# Patient Record
Sex: Male | Born: 1965 | Hispanic: No | Marital: Married | Smoking: Former smoker
Health system: Southern US, Community
[De-identification: ages and names within clinical notes are randomized; demographics above are authoritative.]

## PROBLEM LIST (undated history)

## (undated) DIAGNOSIS — K810 Acute cholecystitis: Secondary | ICD-10-CM

## (undated) DIAGNOSIS — I10 Essential (primary) hypertension: Secondary | ICD-10-CM

## (undated) HISTORY — DX: Acute cholecystitis: K81.0

---

## 2001-04-16 ENCOUNTER — Ambulatory Visit (HOSPITAL_BASED_OUTPATIENT_CLINIC_OR_DEPARTMENT_OTHER): Admission: RE | Admit: 2001-04-16 | Discharge: 2001-04-16 | Payer: Self-pay | Admitting: *Deleted

## 2001-04-16 ENCOUNTER — Encounter: Payer: Self-pay | Admitting: Emergency Medicine

## 2001-09-18 ENCOUNTER — Emergency Department (HOSPITAL_COMMUNITY): Admission: EM | Admit: 2001-09-18 | Discharge: 2001-09-18 | Payer: Self-pay | Admitting: Emergency Medicine

## 2008-10-23 ENCOUNTER — Emergency Department (HOSPITAL_COMMUNITY): Admission: EM | Admit: 2008-10-23 | Discharge: 2008-10-23 | Payer: Self-pay | Admitting: Emergency Medicine

## 2010-08-20 NOTE — Op Note (Signed)
[place]  Patient:    Derek Pierce, Derek Pierce Visit Number: [id number] MRN: [id number]          Service Type: Attending:  Lowell Bouton, M.D. Dictated by:   Lowell Bouton, M.D. Proc. Date: 04/16/01                             Operative Report  PREOPERATIVE DIAGNOSIS:  Open fracture distal phalanx with nail bed injury secondary to crushing, left ring finger.  POSTOPERATIVE DIAGNOSIS:  Open fracture distal phalanx with nail bed injury secondary to crushing, left ring finger.  OPERATION PERFORMED:  Open reduction of distal phalanx with repair of nail bed and pulp laceration left ring finger.  SURGEON:  Lowell Bouton, M.D.  ANESTHESIA:  0.5% Marcaine local.  OPERATIVE FINDINGS:  The patient had a displaced distal phalanx fracture that was opened, involved the nail bed and the pulp tissue.  DESCRIPTION OF PROCEDURE:  Under 0.5% Marcaine local anesthesia, the left hand was prepped and draped in the usual fashion in the minor room.  A Penrose drain tourniquet was used at the base of the digit for bleeding control.  The nail plate was removed and the fracture was identified through the rent in the nail bed.  The fracture was irrigated copiously with saline and was reduced. The nail bed was repaired with 5-0 chromic and the nail plate was reapplied underneath the eponychial fold.  The pulp lacerations were then repaired with 5-0 chromic.  Sterile dressings were applied.  The tourniquet was released. The patient was discharged in good condition. Dictated by:   Lowell Bouton, M.D. Attending:  Lowell Bouton, M.D. DD:  04/16/01 TD:  04/16/01 Job: [id number]

## 2010-08-26 ENCOUNTER — Encounter (HOSPITAL_COMMUNITY): Payer: Self-pay | Admitting: Radiology

## 2010-08-26 ENCOUNTER — Emergency Department (HOSPITAL_COMMUNITY): Payer: Self-pay

## 2010-08-26 ENCOUNTER — Emergency Department (HOSPITAL_COMMUNITY)
Admission: EM | Admit: 2010-08-26 | Discharge: 2010-08-26 | Disposition: A | Discharge status: Home / Self Care | Payer: Self-pay | Attending: Emergency Medicine | Admitting: Emergency Medicine

## 2010-08-26 DIAGNOSIS — K7689 Other specified diseases of liver: Secondary | ICD-10-CM | POA: Insufficient documentation

## 2010-08-26 DIAGNOSIS — N289 Disorder of kidney and ureter, unspecified: Secondary | ICD-10-CM | POA: Insufficient documentation

## 2010-08-26 DIAGNOSIS — R109 Unspecified abdominal pain: Secondary | ICD-10-CM | POA: Insufficient documentation

## 2010-08-26 LAB — CBC
HCT: 45.3 % (ref 39.0–52.0)
Hemoglobin: 15.9 g/dL (ref 13.0–17.0)
MCH: 29.7 pg (ref 26.0–34.0)
MCHC: 35.1 g/dL (ref 30.0–36.0)
MCV: 84.7 fL (ref 78.0–100.0)
Platelets: 217 10*3/uL (ref 150–400)
RBC: 5.35 MIL/uL (ref 4.22–5.81)
RDW: 12.8 % (ref 11.5–15.5)
WBC: 8 10*3/uL (ref 4.0–10.5)

## 2010-08-26 LAB — URINALYSIS, ROUTINE W REFLEX MICROSCOPIC
Bilirubin Urine: NEGATIVE
Glucose, UA: NEGATIVE mg/dL
Hgb urine dipstick: NEGATIVE
Ketones, ur: NEGATIVE mg/dL
Nitrite: NEGATIVE
Protein, ur: NEGATIVE mg/dL
Specific Gravity, Urine: 1.027 (ref 1.005–1.030)
Urobilinogen, UA: 1 mg/dL (ref 0.0–1.0)
pH: 7 (ref 5.0–8.0)

## 2010-08-26 LAB — COMPREHENSIVE METABOLIC PANEL
ALT: 56 U/L — ABNORMAL HIGH (ref 0–53)
AST: 30 U/L (ref 0–37)
Albumin: 4 g/dL (ref 3.5–5.2)
Alkaline Phosphatase: 70 U/L (ref 39–117)
BUN: 15 mg/dL (ref 6–23)
CO2: 23 mEq/L (ref 19–32)
Calcium: 9 mg/dL (ref 8.4–10.5)
Chloride: 100 mEq/L (ref 96–112)
Creatinine, Ser: 0.91 mg/dL (ref 0.4–1.5)
GFR calc Af Amer: >60 mL/min (ref >60)
GFR calc non Af Amer: >60 mL/min (ref >60)
Glucose, Bld: 117 mg/dL — ABNORMAL HIGH (ref 70–99)
Potassium: 3.8 mEq/L (ref 3.5–5.1)
Sodium: 136 mEq/L (ref 135–145)
Total Bilirubin: 0.3 mg/dL (ref 0.3–1.2)
Total Protein: 7.1 g/dL (ref 6.0–8.3)

## 2010-08-26 LAB — DIFFERENTIAL
Basophils Absolute: 0 10*3/uL (ref 0.0–0.1)
Basophils Relative: 0 % (ref 0–1)
Eosinophils Absolute: 0.2 10*3/uL (ref 0.0–0.7)
Eosinophils Relative: 2 % (ref 0–5)
Lymphocytes Relative: 22 % (ref 12–46)
Lymphs Abs: 1.8 10*3/uL (ref 0.7–4.0)
Monocytes Absolute: 0.3 10*3/uL (ref 0.1–1.0)
Monocytes Relative: 4 % (ref 3–12)
Neutro Abs: 5.7 10*3/uL (ref 1.7–7.7)
Neutrophils Relative %: 71 % (ref 43–77)

## 2010-08-26 LAB — LIPASE, BLOOD: Lipase: 33 U/L (ref 11–59)

## 2010-08-26 MED ORDER — IOHEXOL 300 MG/ML  SOLN
100.0000 mL | Freq: Once | INTRAMUSCULAR | Status: AC | PRN
  Administered 2010-08-26: 100 mL via INTRAVENOUS

## 2010-09-03 ENCOUNTER — Other Ambulatory Visit (HOSPITAL_COMMUNITY): Payer: Self-pay | Admitting: Internal Medicine

## 2010-09-03 DIAGNOSIS — N2889 Other specified disorders of kidney and ureter: Secondary | ICD-10-CM

## 2010-09-08 ENCOUNTER — Ambulatory Visit (HOSPITAL_COMMUNITY)
Admission: RE | Admit: 2010-09-08 | Discharge: 2010-09-08 | Disposition: A | Discharge status: Home / Self Care | Payer: Self-pay | Source: Ambulatory Visit | Attending: Internal Medicine | Admitting: Internal Medicine

## 2010-09-08 DIAGNOSIS — N2889 Other specified disorders of kidney and ureter: Secondary | ICD-10-CM

## 2010-09-08 DIAGNOSIS — K8020 Calculus of gallbladder without cholecystitis without obstruction: Secondary | ICD-10-CM | POA: Insufficient documentation

## 2010-09-08 DIAGNOSIS — K7689 Other specified diseases of liver: Secondary | ICD-10-CM | POA: Insufficient documentation

## 2010-09-08 DIAGNOSIS — N289 Disorder of kidney and ureter, unspecified: Secondary | ICD-10-CM | POA: Insufficient documentation

## 2010-09-08 MED ORDER — GADOBENATE DIMEGLUMINE 529 MG/ML IV SOLN
20.0000 mL | Freq: Once | INTRAVENOUS | Status: AC | PRN
  Administered 2010-09-08: 20 mL via INTRAVENOUS

## 2012-05-18 ENCOUNTER — Encounter (HOSPITAL_COMMUNITY): Payer: Self-pay | Admitting: *Deleted

## 2012-05-18 DIAGNOSIS — R109 Unspecified abdominal pain: Secondary | ICD-10-CM | POA: Insufficient documentation

## 2012-05-18 DIAGNOSIS — K8000 Calculus of gallbladder with acute cholecystitis without obstruction: Principal | ICD-10-CM | POA: Insufficient documentation

## 2012-05-18 LAB — CBC WITH DIFFERENTIAL/PLATELET
Basophils Relative: 0 % (ref 0–1)
Hemoglobin: 15.7 g/dL (ref 13.0–17.0)
Lymphs Abs: 1.1 10*3/uL (ref 0.7–4.0)
Monocytes Relative: 2 % — ABNORMAL LOW (ref 3–12)
Neutro Abs: 8.9 10*3/uL — ABNORMAL HIGH (ref 1.7–7.7)
Neutrophils Relative %: 86 % — ABNORMAL HIGH (ref 43–77)
Platelets: 222 10*3/uL (ref 150–400)
RBC: 5.21 MIL/uL (ref 4.22–5.81)
WBC: 10.3 10*3/uL (ref 4.0–10.5)

## 2012-05-18 LAB — COMPREHENSIVE METABOLIC PANEL
ALT: 50 U/L (ref 0–53)
Albumin: 4.1 g/dL (ref 3.5–5.2)
Alkaline Phosphatase: 78 U/L (ref 39–117)
BUN: 15 mg/dL (ref 6–23)
Chloride: 101 mEq/L (ref 96–112)
Glucose, Bld: 117 mg/dL — ABNORMAL HIGH (ref 70–99)
Potassium: 3.7 mEq/L (ref 3.5–5.1)
Sodium: 137 mEq/L (ref 135–145)
Total Bilirubin: 0.3 mg/dL (ref 0.3–1.2)

## 2012-05-18 LAB — LIPASE, BLOOD: Lipase: 25 U/L (ref 11–59)

## 2012-05-18 NOTE — ED Notes (Signed)
The pt is c/o abd pain since 1500 today .  No n v or diarrhea.

## 2012-05-19 ENCOUNTER — Emergency Department (HOSPITAL_COMMUNITY): Payer: Self-pay

## 2012-05-19 ENCOUNTER — Encounter (HOSPITAL_COMMUNITY): Payer: Self-pay | Admitting: Radiology

## 2012-05-19 ENCOUNTER — Observation Stay (HOSPITAL_COMMUNITY)
Admission: EM | Admit: 2012-05-19 | Discharge: 2012-05-21 | Disposition: A | Discharge status: Home / Self Care | Payer: Self-pay | Attending: Surgery | Admitting: Surgery

## 2012-05-19 DIAGNOSIS — K8066 Calculus of gallbladder and bile duct with acute and chronic cholecystitis without obstruction: Secondary | ICD-10-CM

## 2012-05-19 DIAGNOSIS — K810 Acute cholecystitis: Secondary | ICD-10-CM

## 2012-05-19 LAB — URINALYSIS, ROUTINE W REFLEX MICROSCOPIC
Bilirubin Urine: NEGATIVE
Hgb urine dipstick: NEGATIVE
Ketones, ur: 15 mg/dL — AB
Nitrite: NEGATIVE
Specific Gravity, Urine: 1.023 (ref 1.005–1.030)
pH: 6 (ref 5.0–8.0)

## 2012-05-19 MED ORDER — IOHEXOL 300 MG/ML  SOLN
20.0000 mL | INTRAMUSCULAR | Status: DC
  Administered 2012-05-19: 50 mL via ORAL

## 2012-05-19 MED ORDER — PANTOPRAZOLE SODIUM 40 MG IV SOLR
40.0000 mg | Freq: Every day | INTRAVENOUS | Status: DC
  Administered 2012-05-19 – 2012-05-20 (×2): 40 mg via INTRAVENOUS
  Filled 2012-05-19 (×4): qty 40

## 2012-05-19 MED ORDER — SODIUM CHLORIDE 0.9 % IV SOLN
3.0000 g | Freq: Four times a day (QID) | INTRAVENOUS | Status: DC
  Administered 2012-05-19 – 2012-05-20 (×5): 3 g via INTRAVENOUS
  Filled 2012-05-19 (×7): qty 3

## 2012-05-19 MED ORDER — ONDANSETRON HCL 4 MG/2ML IJ SOLN: 4.0000 mg | Freq: Four times a day (QID) | INTRAMUSCULAR | Status: DC | PRN

## 2012-05-19 MED ORDER — ACETAMINOPHEN 325 MG PO TABS
650.0000 mg | ORAL_TABLET | Freq: Four times a day (QID) | ORAL | Status: DC | PRN
  Administered 2012-05-19 – 2012-05-20 (×2): 650 mg via ORAL
  Filled 2012-05-19 (×2): qty 1
  Filled 2012-05-19: qty 2

## 2012-05-19 MED ORDER — HYDROMORPHONE HCL PF 1 MG/ML IJ SOLN
1.0000 mg | INTRAMUSCULAR | Status: DC | PRN
  Administered 2012-05-19: 1 mg via INTRAVENOUS
  Filled 2012-05-19: qty 1

## 2012-05-19 MED ORDER — KCL IN DEXTROSE-NACL 20-5-0.45 MEQ/L-%-% IV SOLN
INTRAVENOUS | Status: DC
  Administered 2012-05-19 – 2012-05-21 (×4): via INTRAVENOUS
  Filled 2012-05-19 (×8): qty 1000

## 2012-05-19 MED ORDER — IOHEXOL 300 MG/ML  SOLN
100.0000 mL | Freq: Once | INTRAMUSCULAR | Status: AC | PRN
  Administered 2012-05-19: 100 mL via INTRAVENOUS

## 2012-05-19 MED ORDER — SODIUM CHLORIDE 0.9 % IV BOLUS (SEPSIS)
500.0000 mL | Freq: Once | INTRAVENOUS | Status: AC
  Administered 2012-05-19: 500 mL via INTRAVENOUS

## 2012-05-19 MED ORDER — HYDROMORPHONE HCL PF 1 MG/ML IJ SOLN
1.0000 mg | Freq: Once | INTRAMUSCULAR | Status: AC
  Administered 2012-05-19: 1 mg via INTRAVENOUS
  Filled 2012-05-19: qty 1

## 2012-05-19 MED ORDER — ENOXAPARIN SODIUM 40 MG/0.4ML ~~LOC~~ SOLN
40.0000 mg | SUBCUTANEOUS | Status: DC
  Administered 2012-05-19 – 2012-05-21 (×2): 40 mg via SUBCUTANEOUS
  Filled 2012-05-19 (×3): qty 0.4

## 2012-05-19 MED ORDER — SODIUM CHLORIDE 0.9 % IV SOLN
INTRAVENOUS | Status: DC
  Administered 2012-05-19: 06:00:00 via INTRAVENOUS

## 2012-05-19 MED ORDER — ONDANSETRON HCL 4 MG/2ML IJ SOLN
4.0000 mg | Freq: Once | INTRAMUSCULAR | Status: AC
  Administered 2012-05-19: 4 mg via INTRAVENOUS
  Filled 2012-05-19: qty 2

## 2012-05-19 MED ORDER — ACETAMINOPHEN 650 MG RE SUPP: 650.0000 mg | Freq: Four times a day (QID) | RECTAL | Status: DC | PRN

## 2012-05-19 NOTE — ED Notes (Signed)
Patient c/o abd pain  Rubbing his entire abd area.  No vomiting but has been nauseated.

## 2012-05-19 NOTE — H&P (Signed)
Derek Pierce is an 47 y.o. male.   Chief Complaint: Abdominal pain with cholelithiasis HPI: Started having abdominal pain with nausea and vomiting about 1500 on February 14, no fevers or chills, no jaundice or pale stools.  History reviewed. No pertinent past medical history.  History reviewed. No pertinent past surgical history.  No family history on file. Social History:  reports that he has quit smoking. He does not have any smokeless tobacco history on file. He reports that he does not drink alcohol. His drug history is not on file.  Allergies: No Known Allergies   (Not in a hospital admission)  Results for orders placed during the hospital encounter of 05/19/12 (from the past 48 hour(s))  CBC WITH DIFFERENTIAL     Status: Abnormal   Collection Time    05/18/12  8:23 PM      Result Value Range   WBC 10.3  4.0 - 10.5 K/uL   RBC 5.21  4.22 - 5.81 MIL/uL   Hemoglobin 15.7  13.0 - 17.0 g/dL   HCT 16.1  09.6 - 04.5 %   MCV 85.4  78.0 - 100.0 fL   MCH 30.1  26.0 - 34.0 pg   MCHC 35.3  30.0 - 36.0 g/dL   RDW 40.9  81.1 - 91.4 %   Platelets 222  150 - 400 K/uL   Neutrophils Relative 86 (*) 43 - 77 %   Neutro Abs 8.9 (*) 1.7 - 7.7 K/uL   Lymphocytes Relative 11 (*) 12 - 46 %   Lymphs Abs 1.1  0.7 - 4.0 K/uL   Monocytes Relative 2 (*) 3 - 12 %   Monocytes Absolute 0.2  0.1 - 1.0 K/uL   Eosinophils Relative 1  0 - 5 %   Eosinophils Absolute 0.1  0.0 - 0.7 K/uL   Basophils Relative 0  0 - 1 %   Basophils Absolute 0.0  0.0 - 0.1 K/uL  COMPREHENSIVE METABOLIC PANEL     Status: Abnormal   Collection Time    05/18/12  8:23 PM      Result Value Range   Sodium 137  135 - 145 mEq/L   Potassium 3.7  3.5 - 5.1 mEq/L   Chloride 101  96 - 112 mEq/L   CO2 20  19 - 32 mEq/L   Glucose, Bld 117 (*) 70 - 99 mg/dL   BUN 15  6 - 23 mg/dL   Creatinine, Ser 7.82  0.50 - 1.35 mg/dL   Calcium 9.2  8.4 - 95.6 mg/dL   Total Protein 7.5  6.0 - 8.3 g/dL   Albumin 4.1  3.5 - 5.2 g/dL   AST  35  0 - 37 U/L   ALT 50  0 - 53 U/L   Alkaline Phosphatase 78  39 - 117 U/L   Total Bilirubin 0.3  0.3 - 1.2 mg/dL   GFR calc non Af Amer >90  >90 mL/min   GFR calc Af Amer >90  >90 mL/min   Comment:            The eGFR has been calculated     using the CKD EPI equation.     This calculation has not been     validated in all clinical     situations.     eGFR's persistently     <90 mL/min signify     possible Chronic Kidney Disease.  LIPASE, BLOOD     Status: None   Collection Time  05/18/12  8:23 PM      Result Value Range   Lipase 25  11 - 59 U/L  URINALYSIS, ROUTINE W REFLEX MICROSCOPIC     Status: Abnormal   Collection Time    05/19/12 12:43 AM      Result Value Range   Color, Urine YELLOW  YELLOW   APPearance CLOUDY (*) CLEAR   Specific Gravity, Urine 1.023  1.005 - 1.030   pH 6.0  5.0 - 8.0   Glucose, UA NEGATIVE  NEGATIVE mg/dL   Hgb urine dipstick NEGATIVE  NEGATIVE   Bilirubin Urine NEGATIVE  NEGATIVE   Ketones, ur 15 (*) NEGATIVE mg/dL   Protein, ur NEGATIVE  NEGATIVE mg/dL   Urobilinogen, UA 1.0  0.0 - 1.0 mg/dL   Nitrite NEGATIVE  NEGATIVE   Leukocytes, UA NEGATIVE  NEGATIVE   Comment: MICROSCOPIC NOT DONE ON URINES WITH NEGATIVE PROTEIN, BLOOD, LEUKOCYTES, NITRITE, OR GLUCOSE <1000 mg/dL.   US Abdomen Complete  05/19/2012  *RADIOLOGY REPORT*  Clinical Data:  Abdominal pain.  Concern for cholecystitis on CT.  ABDOMINAL ULTRASOUND COMPLETE  Comparison:  CT of the abdomen and pelvis performed earlier today at 02:26 a.m., and MRI of the abdomen performed 09/08/2010  Findings:  Gallbladder: Multiple stones are seen layering dependently within the gallbladder.  The gallbladder wall is markedly thickened, measuring up to 1.1 cm, with suggestion of trace pericholecystic fluid, raising concern for acute cholecystitis.  No ultrasonographic Murphy's sign is elicited, though the patient is relatively sedated.  Common Bile Duct:  0.5 cm in diameter; within normal limits in  caliber.  Liver:  Diffusely increased hepatic echogenicity and coarsened echotexture, compatible with fatty infiltration; no focal lesions identified.  Limited Doppler evaluation demonstrates normal blood flow within the liver.  IVC:  Unremarkable in appearance.  Pancreas:  Although the pancreas is difficult to visualize due to overlying bowel gas, no focal pancreatic abnormality is identified.  Spleen:  12.8 cm in length; within normal limits in size and echotexture.  Right kidney:  10.9 cm in length; normal in size, configuration and parenchymal echogenicity.  No evidence of hydronephrosis.  The patient's known right renal lesion is not well characterized on ultrasound.  Left kidney:  11.8 cm in length; normal in size, configuration and parenchymal echogenicity.  No evidence of mass or hydronephrosis.  Abdominal Aorta:  Normal in caliber; no aneurysm identified.  IMPRESSION:  1.  Marked thickening of the gallbladder wall, measuring up to 1.1 cm, with suggestion of trace pericholecystic fluid, raising concern for acute cholecystitis.  No ultrasonographic Murphy's sign elicited, though the patient is relatively sedated.  Stones noted within the gallbladder. 2.  Diffuse fatty infiltration within the liver. 3.  Known small right renal lesion is not well characterized on ultrasound.   Original Report Authenticated By: Tonia Ghent, M.D.    Ct Abdomen Pelvis W Contrast  05/19/2012  *RADIOLOGY REPORT*  Clinical Data: Diffuse abdominal pain.  CT ABDOMEN AND PELVIS WITH CONTRAST  Technique:  Multidetector CT imaging of the abdomen and pelvis was performed following the standard protocol during bolus administration of intravenous contrast.  Contrast: OMNIPAQUE IOHEXOL 300 MG/ML  SOLN  Comparison: CT of the abdomen and pelvis performed 08/26/2010, and MRI of the abdomen performed 09/08/2010  Findings: The visualized lung bases are clear.  The liver and spleen are unremarkable in appearance.  Mild soft tissue  inflammation is noted about the gallbladder, raising concern for mild acute cholecystitis.  No pericholecystic fluid is seen. The  pancreas and adrenal glands are unremarkable.  A 1.5 cm hypodensity at the interpole region of the right kidney is grossly unchanged from 2012 and likely benign.  There is no evidence of hydronephrosis.  No renal or ureteral stones are seen. No perinephric stranding is appreciated.  No free fluid is identified.  The small bowel is unremarkable in appearance.  The stomach is within normal limits.  No acute vascular abnormalities are seen.  The appendix is normal in caliber, without evidence for appendicitis.  The colon is grossly unremarkable in appearance. Mild fatty infiltration within the wall of the distal sigmoid colon and rectum may reflect chronic sequelae of inflammation.  The bladder is is mildly distended and grossly unremarkable in appearance.  The prostate is normal in size.  No inguinal lymphadenopathy is seen.  No acute osseous abnormalities are identified.  IMPRESSION:  1.  Mild soft tissue inflammation about the gallbladder, raising concern for mild acute cholecystitis.  No pericholecystic fluid seen; no definite stones identified on CT. 2.  Stable 1.5 cm hypodensity at the interpole region of the right kidney is likely benign.   Original Report Authenticated By: Tonia Ghent, M.D.     Review of Systems  Constitutional: Negative for fever and chills.  Gastrointestinal: Positive for nausea, vomiting and abdominal pain. Negative for diarrhea and constipation.  All other systems reviewed and are negative.    Blood pressure 155/91, pulse 90, temperature 98 F (36.7 C), temperature source Oral, resp. rate 18, SpO2 95.00%. Physical Exam   Assessment/Plan Acute cholecystitis with cholelithiasis.  IV antibiotics and IV hydration. NPO. Lap chole with IOC. To OR when available.  Cherylynn Ridges 05/19/2012, 7:16 AM

## 2012-05-19 NOTE — ED Provider Notes (Signed)
History     CSN: [id number]  Arrival date & time 05/18/12  2007   First MD Initiated Contact with Patient 05/19/12 0045      Chief Complaint  Patient presents with  . Abdominal Pain    (Consider location/radiation/quality/duration/timing/severity/associated sxs/prior treatment) Patient is a 47 y.o. male presenting with abdominal pain. The history is provided by the patient and a relative.  Abdominal Pain Associated symptoms: no chest pain, no diarrhea, no dysuria, no fever, no hematuria, no nausea, no shortness of breath and no vomiting    patient is from [place] and [place] is limited however family members speak good [place]. According to family the patient's abdominal pain started at 3:00 on Friday. Thank constant since that time patient feels like it's all over the abdomen not able to localize where it's coming from. Pain seems to be fairly significant patient rates it at 6/10. No nausea vomiting or diarrhea. Says that patient pain does radiate to the back.  Past medical history is negative.  History reviewed. No pertinent past medical history.  History reviewed. No pertinent past surgical history.  No family history on file.  History  Substance Use Topics  . Smoking status: Former Games developer  . Smokeless tobacco: Not on file  . Alcohol Use: No      Review of Systems  Constitutional: Negative for fever.  HENT: Negative for congestion.   Eyes: Negative for visual disturbance.  Respiratory: Negative for shortness of breath.   Cardiovascular: Negative for chest pain.  Gastrointestinal: Positive for abdominal pain. Negative for nausea, vomiting and diarrhea.  Genitourinary: Negative for dysuria and hematuria.  Musculoskeletal: Positive for back pain.  Skin: Negative for rash.  Neurological: Negative for headaches.  Hematological: Does not bruise/bleed easily.    Allergies  Review of patient's allergies indicates no known allergies.  Home Medications   Current  Outpatient Rx  Name  Route  Sig  Dispense  Refill  . acetaminophen (TYLENOL) 325 MG tablet   Oral   Take 650 mg by mouth every 6 (six) hours as needed for pain.           BP 142/90  Pulse 86  Temp(Src) 98 F (36.7 C) (Oral)  Resp 18  SpO2 93%  Physical Exam  Nursing note and vitals reviewed. Constitutional: He is oriented to person, place, and time. He appears well-developed and well-nourished. No distress.  HENT:  Head: Normocephalic and atraumatic.  Mouth/Throat: Oropharynx is clear and moist.  Eyes: Conjunctivae and EOM are normal. Pupils are equal, round, and reactive to light.  Neck: Normal range of motion. Neck supple.  Cardiovascular: Normal rate, regular rhythm and normal heart sounds.   No murmur heard. Pulmonary/Chest: Effort normal and breath sounds normal. No respiratory distress.  Abdominal: Soft. Bowel sounds are normal. There is tenderness.  Tender predominantly right upper quadrant. With mild guarding.  Musculoskeletal: Normal range of motion.  Neurological: He is alert and oriented to person, place, and time. No cranial nerve deficit. He exhibits normal muscle tone. Coordination normal.  Skin: Skin is warm. No erythema.    ED Course  Procedures (including critical care time)  Labs Reviewed  CBC WITH DIFFERENTIAL - Abnormal; Notable for the following:    Neutrophils Relative 86 (*)    Neutro Abs 8.9 (*)    Lymphocytes Relative 11 (*)    Monocytes Relative 2 (*)    All other components within normal limits  COMPREHENSIVE METABOLIC PANEL - Abnormal; Notable for the following:  Glucose, Bld 117 (*)    All other components within normal limits  URINALYSIS, ROUTINE W REFLEX MICROSCOPIC - Abnormal; Notable for the following:    APPearance CLOUDY (*)    Ketones, ur 15 (*)    All other components within normal limits  LIPASE, BLOOD   US Abdomen Complete  05/19/2012  *RADIOLOGY REPORT*  Clinical Data:  Abdominal pain.  Concern for cholecystitis on CT.   ABDOMINAL ULTRASOUND COMPLETE  Comparison:  CT of the abdomen and pelvis performed earlier today at 02:26 a.m., and MRI of the abdomen performed 09/08/2010  Findings:  Gallbladder: Multiple stones are seen layering dependently within the gallbladder.  The gallbladder wall is markedly thickened, measuring up to 1.1 cm, with suggestion of trace pericholecystic fluid, raising concern for acute cholecystitis.  No ultrasonographic Murphy's sign is elicited, though the patient is relatively sedated.  Common Bile Duct:  0.5 cm in diameter; within normal limits in caliber.  Liver:  Diffusely increased hepatic echogenicity and coarsened echotexture, compatible with fatty infiltration; no focal lesions identified.  Limited Doppler evaluation demonstrates normal blood flow within the liver.  IVC:  Unremarkable in appearance.  Pancreas:  Although the pancreas is difficult to visualize due to overlying bowel gas, no focal pancreatic abnormality is identified.  Spleen:  12.8 cm in length; within normal limits in size and echotexture.  Right kidney:  10.9 cm in length; normal in size, configuration and parenchymal echogenicity.  No evidence of hydronephrosis.  The patient's known right renal lesion is not well characterized on ultrasound.  Left kidney:  11.8 cm in length; normal in size, configuration and parenchymal echogenicity.  No evidence of mass or hydronephrosis.  Abdominal Aorta:  Normal in caliber; no aneurysm identified.  IMPRESSION:  1.  Marked thickening of the gallbladder wall, measuring up to 1.1 cm, with suggestion of trace pericholecystic fluid, raising concern for acute cholecystitis.  No ultrasonographic Murphy's sign elicited, though the patient is relatively sedated.  Stones noted within the gallbladder. 2.  Diffuse fatty infiltration within the liver. 3.  Known small right renal lesion is not well characterized on ultrasound.   Original Report Authenticated By: Tonia Ghent, M.D.    Ct Abdomen Pelvis W  Contrast  05/19/2012  *RADIOLOGY REPORT*  Clinical Data: Diffuse abdominal pain.  CT ABDOMEN AND PELVIS WITH CONTRAST  Technique:  Multidetector CT imaging of the abdomen and pelvis was performed following the standard protocol during bolus administration of intravenous contrast.  Contrast: OMNIPAQUE IOHEXOL 300 MG/ML  SOLN  Comparison: CT of the abdomen and pelvis performed 08/26/2010, and MRI of the abdomen performed 09/08/2010  Findings: The visualized lung bases are clear.  The liver and spleen are unremarkable in appearance.  Mild soft tissue inflammation is noted about the gallbladder, raising concern for mild acute cholecystitis.  No pericholecystic fluid is seen. The pancreas and adrenal glands are unremarkable.  A 1.5 cm hypodensity at the interpole region of the right kidney is grossly unchanged from 2012 and likely benign.  There is no evidence of hydronephrosis.  No renal or ureteral stones are seen. No perinephric stranding is appreciated.  No free fluid is identified.  The small bowel is unremarkable in appearance.  The stomach is within normal limits.  No acute vascular abnormalities are seen.  The appendix is normal in caliber, without evidence for appendicitis.  The colon is grossly unremarkable in appearance. Mild fatty infiltration within the wall of the distal sigmoid colon and rectum may reflect chronic sequelae of  inflammation.  The bladder is is mildly distended and grossly unremarkable in appearance.  The prostate is normal in size.  No inguinal lymphadenopathy is seen.  No acute osseous abnormalities are identified.  IMPRESSION:  1.  Mild soft tissue inflammation about the gallbladder, raising concern for mild acute cholecystitis.  No pericholecystic fluid seen; no definite stones identified on CT. 2.  Stable 1.5 cm hypodensity at the interpole region of the right kidney is likely benign.   Original Report Authenticated By: Tonia Ghent, M.D.    Results for orders placed during the  hospital encounter of 05/19/12  CBC WITH DIFFERENTIAL      Result Value Range   WBC 10.3  4.0 - 10.5 K/uL   RBC 5.21  4.22 - 5.81 MIL/uL   Hemoglobin 15.7  13.0 - 17.0 g/dL   HCT 95.6  21.3 - 08.6 %   MCV 85.4  78.0 - 100.0 fL   MCH 30.1  26.0 - 34.0 pg   MCHC 35.3  30.0 - 36.0 g/dL   RDW 57.8  46.9 - 62.9 %   Platelets 222  150 - 400 K/uL   Neutrophils Relative 86 (*) 43 - 77 %   Neutro Abs 8.9 (*) 1.7 - 7.7 K/uL   Lymphocytes Relative 11 (*) 12 - 46 %   Lymphs Abs 1.1  0.7 - 4.0 K/uL   Monocytes Relative 2 (*) 3 - 12 %   Monocytes Absolute 0.2  0.1 - 1.0 K/uL   Eosinophils Relative 1  0 - 5 %   Eosinophils Absolute 0.1  0.0 - 0.7 K/uL   Basophils Relative 0  0 - 1 %   Basophils Absolute 0.0  0.0 - 0.1 K/uL  COMPREHENSIVE METABOLIC PANEL      Result Value Range   Sodium 137  135 - 145 mEq/L   Potassium 3.7  3.5 - 5.1 mEq/L   Chloride 101  96 - 112 mEq/L   CO2 20  19 - 32 mEq/L   Glucose, Bld 117 (*) 70 - 99 mg/dL   BUN 15  6 - 23 mg/dL   Creatinine, Ser 5.28  0.50 - 1.35 mg/dL   Calcium 9.2  8.4 - 41.3 mg/dL   Total Protein 7.5  6.0 - 8.3 g/dL   Albumin 4.1  3.5 - 5.2 g/dL   AST 35  0 - 37 U/L   ALT 50  0 - 53 U/L   Alkaline Phosphatase 78  39 - 117 U/L   Total Bilirubin 0.3  0.3 - 1.2 mg/dL   GFR calc non Af Amer >90  >90 mL/min   GFR calc Af Amer >90  >90 mL/min  LIPASE, BLOOD      Result Value Range   Lipase 25  11 - 59 U/L  URINALYSIS, ROUTINE W REFLEX MICROSCOPIC      Result Value Range   Color, Urine YELLOW  YELLOW   APPearance CLOUDY (*) CLEAR   Specific Gravity, Urine 1.023  1.005 - 1.030   pH 6.0  5.0 - 8.0   Glucose, UA NEGATIVE  NEGATIVE mg/dL   Hgb urine dipstick NEGATIVE  NEGATIVE   Bilirubin Urine NEGATIVE  NEGATIVE   Ketones, ur 15 (*) NEGATIVE mg/dL   Protein, ur NEGATIVE  NEGATIVE mg/dL   Urobilinogen, UA 1.0  0.0 - 1.0 mg/dL   Nitrite NEGATIVE  NEGATIVE   Leukocytes, UA NEGATIVE  NEGATIVE     1. Acute cholecystitis       MDM  CT of  abdomen and ultrasound of abdomen confirms markedly enlarged gallbladder consistent with acute cholecystitis. Patient's common bile duct is normal no significant leukocytosis liver function tests and lipase are normal. The patient now is tender over the right upper quadrant despite pain medication. Discussed with general surgery they will see in consultation patient will most likely require admission. Patient's past medical history is negative.        Shelda Jakes, MD 05/19/12 [phone]

## 2012-05-20 ENCOUNTER — Observation Stay (HOSPITAL_COMMUNITY): Payer: Self-pay

## 2012-05-20 ENCOUNTER — Encounter (HOSPITAL_COMMUNITY): Payer: Self-pay | Admitting: Anesthesiology

## 2012-05-20 ENCOUNTER — Encounter (HOSPITAL_COMMUNITY)
Admission: EM | Disposition: A | Discharge status: Home / Self Care | Payer: Self-pay | Source: Home / Self Care | Attending: Emergency Medicine

## 2012-05-20 ENCOUNTER — Observation Stay (HOSPITAL_COMMUNITY): Payer: Self-pay | Admitting: Anesthesiology

## 2012-05-20 HISTORY — PX: CHOLECYSTECTOMY: SHX55

## 2012-05-20 LAB — SURGICAL PCR SCREEN: MRSA, PCR: NEGATIVE

## 2012-05-20 SURGERY — LAPAROSCOPIC CHOLECYSTECTOMY WITH INTRAOPERATIVE CHOLANGIOGRAM
Anesthesia: General | Site: Abdomen | Wound class: Clean Contaminated

## 2012-05-20 MED ORDER — ROCURONIUM BROMIDE 100 MG/10ML IV SOLN
INTRAVENOUS | Status: DC | PRN
  Administered 2012-05-20: 50 mg via INTRAVENOUS

## 2012-05-20 MED ORDER — OXYCODONE HCL 5 MG/5ML PO SOLN: 5.0000 mg | Freq: Once | ORAL | Status: AC | PRN

## 2012-05-20 MED ORDER — ONDANSETRON HCL 4 MG/2ML IJ SOLN: 4.0000 mg | Freq: Four times a day (QID) | INTRAMUSCULAR | Status: DC | PRN

## 2012-05-20 MED ORDER — GLYCOPYRROLATE 0.2 MG/ML IJ SOLN
INTRAMUSCULAR | Status: DC | PRN
  Administered 2012-05-20: .4 mg via INTRAVENOUS

## 2012-05-20 MED ORDER — LIDOCAINE HCL 4 % MT SOLN
OROMUCOSAL | Status: DC | PRN
  Administered 2012-05-20: 4 mL via TOPICAL

## 2012-05-20 MED ORDER — SODIUM CHLORIDE 0.9 % IV SOLN
INTRAVENOUS | Status: DC | PRN
  Administered 2012-05-20: 11:00:00

## 2012-05-20 MED ORDER — DEXAMETHASONE SODIUM PHOSPHATE 4 MG/ML IJ SOLN
INTRAMUSCULAR | Status: DC | PRN
  Administered 2012-05-20: 4 mg via INTRAVENOUS

## 2012-05-20 MED ORDER — HYDROMORPHONE HCL PF 1 MG/ML IJ SOLN
0.2500 mg | INTRAMUSCULAR | Status: DC | PRN
  Administered 2012-05-20 (×2): 0.5 mg via INTRAVENOUS

## 2012-05-20 MED ORDER — OXYCODONE-ACETAMINOPHEN 5-325 MG PO TABS
1.0000 | ORAL_TABLET | ORAL | Status: DC | PRN
  Administered 2012-05-20 – 2012-05-21 (×3): 2 via ORAL
  Filled 2012-05-20 (×3): qty 2

## 2012-05-20 MED ORDER — NEOSTIGMINE METHYLSULFATE 1 MG/ML IJ SOLN
INTRAMUSCULAR | Status: DC | PRN
  Administered 2012-05-20: 3 mg via INTRAVENOUS

## 2012-05-20 MED ORDER — BUPIVACAINE-EPINEPHRINE 0.25% -1:200000 IJ SOLN
INTRAMUSCULAR | Status: DC | PRN
  Administered 2012-05-20: 20 mL

## 2012-05-20 MED ORDER — ONDANSETRON HCL 4 MG/2ML IJ SOLN
INTRAMUSCULAR | Status: DC | PRN
  Administered 2012-05-20: 4 mg via INTRAVENOUS

## 2012-05-20 MED ORDER — SODIUM CHLORIDE 0.9 % IR SOLN
Status: DC | PRN
  Administered 2012-05-20: 1

## 2012-05-20 MED ORDER — LACTATED RINGERS IV SOLN
INTRAVENOUS | Status: DC | PRN
  Administered 2012-05-20: 10:00:00 via INTRAVENOUS

## 2012-05-20 MED ORDER — FENTANYL CITRATE 0.05 MG/ML IJ SOLN
INTRAMUSCULAR | Status: DC | PRN
  Administered 2012-05-20: 50 ug via INTRAVENOUS
  Administered 2012-05-20: 150 ug via INTRAVENOUS
  Administered 2012-05-20: 100 ug via INTRAVENOUS

## 2012-05-20 MED ORDER — PROPOFOL 10 MG/ML IV BOLUS
INTRAVENOUS | Status: DC | PRN
  Administered 2012-05-20: 200 mg via INTRAVENOUS

## 2012-05-20 MED ORDER — LABETALOL HCL 5 MG/ML IV SOLN
INTRAVENOUS | Status: DC | PRN
  Administered 2012-05-20: 10 mg via INTRAVENOUS

## 2012-05-20 MED ORDER — HEMOSTATIC AGENTS (NO CHARGE) OPTIME
TOPICAL | Status: DC | PRN
  Administered 2012-05-20: 1 via TOPICAL

## 2012-05-20 MED ORDER — SODIUM CHLORIDE 0.9 % IR SOLN
Status: DC | PRN
  Administered 2012-05-20: 3000 mL

## 2012-05-20 MED ORDER — SODIUM CHLORIDE 0.9 % IV SOLN
3.0000 g | Freq: Four times a day (QID) | INTRAVENOUS | Status: AC
  Administered 2012-05-20 – 2012-05-21 (×3): 3 g via INTRAVENOUS
  Filled 2012-05-20 (×3): qty 3

## 2012-05-20 MED ORDER — OXYCODONE HCL 5 MG PO TABS: 5.0000 mg | ORAL_TABLET | Freq: Once | ORAL | Status: AC | PRN

## 2012-05-20 MED ORDER — LIDOCAINE HCL (CARDIAC) 20 MG/ML IV SOLN
INTRAVENOUS | Status: DC | PRN
  Administered 2012-05-20: 80 mg via INTRAVENOUS

## 2012-05-20 MED ORDER — MIDAZOLAM HCL 5 MG/5ML IJ SOLN
INTRAMUSCULAR | Status: DC | PRN
  Administered 2012-05-20: 1 mg via INTRAVENOUS

## 2012-05-20 SURGICAL SUPPLY — 50 items
ADH SKN CLS APL DERMABOND .7 (GAUZE/BANDAGES/DRESSINGS) ×1
ADH SKN CLS LQ APL DERMABOND (GAUZE/BANDAGES/DRESSINGS) ×1
APPLIER CLIP 5 13 M/L LIGAMAX5 (MISCELLANEOUS) ×2
APPLIER CLIP ROT 10 11.4 M/L (STAPLE) ×2
APR CLP MED LRG 11.4X10 (STAPLE) ×1
APR CLP MED LRG 5 ANG JAW (MISCELLANEOUS) ×1
BAG SPEC RTRVL LRG 6X4 10 (ENDOMECHANICALS) ×1
BLADE SURG ROTATE 9660 (MISCELLANEOUS) ×1 IMPLANT
CANISTER SUCTION 2500CC (MISCELLANEOUS) ×2 IMPLANT
CHLORAPREP W/TINT 26ML (MISCELLANEOUS) ×2 IMPLANT
CLIP APPLIE 5 13 M/L LIGAMAX5 (MISCELLANEOUS) ×1 IMPLANT
CLIP APPLIE ROT 10 11.4 M/L (STAPLE) IMPLANT
CLOTH BEACON ORANGE TIMEOUT ST (SAFETY) ×2 IMPLANT
CLSR STERI-STRIP ANTIMIC 1/2X4 (GAUZE/BANDAGES/DRESSINGS) ×1 IMPLANT
COVER MAYO STAND STRL (DRAPES) ×2 IMPLANT
COVER SURGICAL LIGHT HANDLE (MISCELLANEOUS) ×2 IMPLANT
DECANTER SPIKE VIAL GLASS SM (MISCELLANEOUS) ×4 IMPLANT
DERMABOND ADHESIVE PROPEN (GAUZE/BANDAGES/DRESSINGS) ×1
DERMABOND ADVANCED (GAUZE/BANDAGES/DRESSINGS) ×1
DERMABOND ADVANCED .7 DNX12 (GAUZE/BANDAGES/DRESSINGS) ×1 IMPLANT
DERMABOND ADVANCED .7 DNX6 (GAUZE/BANDAGES/DRESSINGS) IMPLANT
DRAPE C-ARM 42X72 X-RAY (DRAPES) ×2 IMPLANT
DRAPE UTILITY 15X26 W/TAPE STR (DRAPE) ×4 IMPLANT
DRSG TEGADERM 4X4.75 (GAUZE/BANDAGES/DRESSINGS) ×1 IMPLANT
ELECT REM PT RETURN 9FT ADLT (ELECTROSURGICAL) ×2
ELECTRODE REM PT RTRN 9FT ADLT (ELECTROSURGICAL) ×1 IMPLANT
GLOVE BIOGEL PI IND STRL 8 (GLOVE) ×1 IMPLANT
GLOVE BIOGEL PI INDICATOR 8 (GLOVE) ×1
GLOVE ECLIPSE 7.5 STRL STRAW (GLOVE) ×2 IMPLANT
GOWN STRL NON-REIN LRG LVL3 (GOWN DISPOSABLE) ×4 IMPLANT
IV NS 1000ML (IV SOLUTION) ×6
IV NS 1000ML BAXH (IV SOLUTION) IMPLANT
KIT BASIN OR (CUSTOM PROCEDURE TRAY) ×2 IMPLANT
KIT ROOM TURNOVER OR (KITS) ×2 IMPLANT
NS IRRIG 1000ML POUR BTL (IV SOLUTION) ×2 IMPLANT
PAD ARMBOARD 7.5X6 YLW CONV (MISCELLANEOUS) ×4 IMPLANT
POUCH SPECIMEN RETRIEVAL 10MM (ENDOMECHANICALS) ×1 IMPLANT
SCISSORS LAP 5X35 DISP (ENDOMECHANICALS) ×1 IMPLANT
SET CHOLANGIOGRAPH 5 50 .035 (SET/KITS/TRAYS/PACK) ×2 IMPLANT
SET IRRIG TUBING LAPAROSCOPIC (IRRIGATION / IRRIGATOR) ×3 IMPLANT
SLEEVE ENDOPATH XCEL 5M (ENDOMECHANICALS) ×4 IMPLANT
SPECIMEN JAR SMALL (MISCELLANEOUS) ×2 IMPLANT
SUT MNCRL AB 4-0 PS2 18 (SUTURE) ×3 IMPLANT
TOWEL OR 17X24 6PK STRL BLUE (TOWEL DISPOSABLE) ×2 IMPLANT
TOWEL OR 17X26 10 PK STRL BLUE (TOWEL DISPOSABLE) ×2 IMPLANT
TRAY LAPAROSCOPIC (CUSTOM PROCEDURE TRAY) ×2 IMPLANT
TROCAR XCEL BLUNT TIP 100MML (ENDOMECHANICALS) ×2 IMPLANT
TROCAR XCEL NON-BLD 11X100MML (ENDOMECHANICALS) IMPLANT
TROCAR XCEL NON-BLD 5MMX100MML (ENDOMECHANICALS) ×2 IMPLANT
WATER STERILE IRR 1000ML POUR (IV SOLUTION) IMPLANT

## 2012-05-20 NOTE — Anesthesia Postprocedure Evaluation (Signed)
Anesthesia Post Note  Patient: Derek Pierce, Derek Pierce  Procedure(s) Performed: Procedure(s) (LRB): LAPAROSCOPIC CHOLECYSTECTOMY WITH INTRAOPERATIVE CHOLANGIOGRAM (N/A)  Anesthesia type: General  Patient location: PACU  Post pain: Pain level controlled and Adequate analgesia  Post assessment: Post-op Vital signs reviewed, Patient's Cardiovascular Status Stable, Respiratory Function Stable, Patent Airway and Pain level controlled  Last Vitals:  Filed Vitals:   05/20/12 1215  BP: 139/64  Pulse: 72  Temp: 36.4 C  Resp: 12    Post vital signs: Reviewed and stable  Level of consciousness: awake, alert  and oriented  Complications: No apparent anesthesia complications

## 2012-05-20 NOTE — Op Note (Signed)
OPERATIVE REPORT  DATE OF OPERATION: 05/19/2012 - 05/20/2012  PATIENT:  Derek Pierce  47 y.o. male  PRE-OPERATIVE DIAGNOSIS:  Acute cholelithiasis  POST-OPERATIVE DIAGNOSIS:  Acute cholelithiasis  PROCEDURE:  Procedure(s): LAPAROSCOPIC CHOLECYSTECTOMY WITH INTRAOPERATIVE CHOLANGIOGRAM  SURGEON:  Surgeon(s): Cherylynn Ridges, MD  ASSISTANT: None  ANESTHESIA:   general  EBL: <75 ml  BLOOD ADMINISTERED: none  DRAINS: none   SPECIMEN:  Source of Specimen:  Gallbladder with stones  COUNTS CORRECT:  YES  PROCEDURE DETAILS: The patient was taken to the operating room and placed on the table in the supine position.  After an adequate endotracheal anesthetic was administered, (she/he) was prepped with (ChloroPrep/Betadine), and then draped in the usual manner exposing the entire abdomen laterally, inferiorly and up  to the costal margins.  After a proper timeout was performed including identifying the patient and the procedure to be performed, a supra-umbilical1.5cm midline incision was made using a #15 blade.  This was taken down to the fascia which was then incised with a #15 blade.  The edges of the fascia were tented up with Kocher clamps as the preperitoneal space was penetrated with a Kelly clamp into the peritoneum.  Once this was done, a pursestring suture of 0 Vicryl was passed around the fascial opening.  This was subsequently used to secure the [facility] cannula which was passed into the peritoneal cavity.  Once the [facility] cannula was in place, carbon dioxide gas was insufflated into the peritoneal cavity up to a maximal intra-abdominal pressure of 15mm Hg.The laparoscope, with attached camera and light source, was passed into the peritoneal cavity to visualize the direct insertion of two right upper quadrant 5mm cannulas, and a sup-xiphoid 10-48mm cannula.  Once all cannulas were in place, the dissection was begun.  Two ratcheted graspers were attached to the dome and  infundibulum of the gallbladder and retracted towards the anterior abdominal wall and the right upper quadrant.  Just after this was done the bottom of the operating table abruptly jerked downward causing a slight tear of the liver capsule just to the right of the gallbladder fossa.  Using cautery attached to a dissecting forceps we causterized this area adequately to control the bleeding prior to closure. The peritoneum overlaying the triangle of Chalot and the hepatoduodenal triangle was dissected away exposing the cystic duct and the cystic artery.  A clip was placed on the gallbladder side of the cystic duct, then a cholecytodochotomy made using the laparoscopic scissors.  Through the cholecystodochotomy a Cook catheter was passed to performed a cholangiogram.  The cholangiogram showed good flow into the duodenum, no intraductal defects, good proximal filling, and no dilatation..  Once the cholangiogram was completed, the [facility] catheter was removed, and the distal cystic duct was clipped multiple times then transected.  The gallbladder was then dissected out of the hepatic bed without event.  It was retrieved from the abdomen (using an EndoCatch bag) without event.  Once the gallbladder was removed, the bed was inspected for hemostasis.  Once excellent hemostasis was obtained all gas and fluids were aspirated from above the liver, then the cannulas were removed.  The supra-umbilical incision was closed using the pursestring suture which was in place.  0.25% bupivicaine with epinephrine was injected at all sites.  All 10mm or greater cannula sites were close using a running subcuticular stitch of 4-0 Monocryl.  5.64mm cannula sites were closed with Dermabond only.Steri-Strips and Tagaderm were used to complete the dressings at all sites.  At this point all needle, sponge, and instrument counts were correct.The patient was awakened from anesthesia and taken to the PACU in stable condition.  PATIENT  DISPOSITION:  PACU - hemodynamically stable.   Cherylynn Ridges 2/16/201411:29 AM

## 2012-05-20 NOTE — Progress Notes (Signed)
Patient was scheduled to get Lovenox at 1900.  Patient is recently postop today at 12:30.  Called pharmacist who advised that the Lovenox should be held at least 12 hours postop to reduce the chance of bleeding.  Rescheduled Lovenox administration for 0600 on 05/21/2012.  Roland Rack, RN

## 2012-05-20 NOTE — Interval H&P Note (Signed)
History and Physical Interval Note:  05/20/2012 7:30 AM  El [employer]  has presented today for surgery, with the diagnosis of Acute cholelithiasis  The various methods of treatment have been discussed with the patient and family. After consideration of risks, benefits and other options for treatment, the patient has consented to  Procedure(s): LAPAROSCOPIC CHOLECYSTECTOMY WITH INTRAOPERATIVE CHOLANGIOGRAM (N/A) as a surgical intervention .  The patient's history has been reviewed, patient examined, no change in status, stable for surgery.  I have reviewed the patient's chart and labs.  Questions were answered to the patient's satisfaction.  The patient is less symptomatic now.  Has  Headache.  Has been NPO.  Will take to the OR today.   Zsazsa Bahena, Marta Lamas

## 2012-05-20 NOTE — Progress Notes (Signed)
Utilization review completed.  

## 2012-05-20 NOTE — Transfer of Care (Signed)
Immediate Anesthesia Transfer of Care Note  Patient: Derek Pierce, Derek Pierce  Procedure(s) Performed: Procedure(s): LAPAROSCOPIC CHOLECYSTECTOMY WITH INTRAOPERATIVE CHOLANGIOGRAM (N/A)  Patient Location: PACU  Anesthesia Type:General  Level of Consciousness: awake, alert  and oriented  Airway & Oxygen Therapy: Patient Spontanous Breathing and Patient connected to nasal cannula oxygen  Post-op Assessment: Report given to PACU RN and Post -op Vital signs reviewed and stable  Post vital signs: Reviewed and stable  Complications: No apparent anesthesia complications

## 2012-05-20 NOTE — Preoperative (Signed)
Beta Blockers   Reason not to administer Beta Blockers:Not Applicable 

## 2012-05-20 NOTE — Anesthesia Preprocedure Evaluation (Signed)
Anesthesia Evaluation  Patient identified by MRN, date of birth, ID band Patient awake    Reviewed: Allergy & Precautions, H&P , NPO status , Patient's Chart, lab work & pertinent test results  Airway Mallampati: II  Neck ROM: full    Dental   Pulmonary former smoker,          Cardiovascular     Neuro/Psych    GI/Hepatic   Endo/Other    Renal/GU      Musculoskeletal   Abdominal   Peds  Hematology   Anesthesia Other Findings   Reproductive/Obstetrics                           Anesthesia Physical Anesthesia Plan  ASA: I  Anesthesia Plan: General   Post-op Pain Management:    Induction: Intravenous  Airway Management Planned: Oral ETT  Additional Equipment:   Intra-op Plan:   Post-operative Plan: Extubation in OR  Informed Consent: I have reviewed the patients History and Physical, chart, labs and discussed the procedure including the risks, benefits and alternatives for the proposed anesthesia with the patient or authorized representative who has indicated his/her understanding and acceptance.     Plan Discussed with: CRNA and Surgeon  Anesthesia Plan Comments:         Anesthesia Quick Evaluation

## 2012-05-21 MED ORDER — OXYCODONE-ACETAMINOPHEN 5-325 MG PO TABS: 1.0000 | ORAL_TABLET | ORAL | Status: DC | PRN

## 2012-05-21 NOTE — Discharge Summary (Signed)
Patient ID: Derek Pierce MRN: [id number] DOB/AGE: 07/28/1965 47 y.o.  Admit date: 05/19/2012 Discharge date: 05/21/2012  Procedures: lap chole with IOC  Consults: None  Reason for Admission: Started having abdominal pain with nausea and vomiting about 1500 on February 14, no fevers or chills, no jaundice or pale stools.  Admission Diagnoses:  1. Acute cholecystitis with cholelithiasis  Hospital Course: The patient was admitted and taken to the operating room where he underwent a lap chole.  He tolerated the procedure well.  He was tolerating a regular diet and pain was well controlled on POD#1.  He was stable for dc home.  PE: Abd: soft, appropriately tender, +BS, incisions c/d/i  Discharge Diagnoses:  1. Acute cholecystitis, s/p lap chole  Discharge Medications:   Medication List    STOP taking these medications       acetaminophen 325 MG tablet  Commonly known as:  TYLENOL      TAKE these medications       oxyCODONE-acetaminophen 5-325 MG per tablet  Commonly known as:  PERCOCET/ROXICET  Take 1-2 tablets by mouth every 4 (four) hours as needed.        Discharge Instructions:     Follow-up Information   Follow up with [facility] On 06/12/2012. (11:00am, arrive at 10:30am)    Contact information:   [address]   [place] [address] [phone]       Signed: Letha Cape 05/21/2012, 8:27 AM

## 2012-05-21 NOTE — Discharge Summary (Signed)
I have seen and examined the patient and agree with the assessment and plans.  Galya Dunnigan A. Arnetha Silverthorne  MD, FACS  

## 2012-05-21 NOTE — Progress Notes (Signed)
Discharged home accompanied by wife and son.Derek Pierce 05/21/2012

## 2012-05-23 ENCOUNTER — Encounter (HOSPITAL_COMMUNITY): Payer: Self-pay | Admitting: General Surgery

## 2012-06-12 ENCOUNTER — Encounter (INDEPENDENT_AMBULATORY_CARE_PROVIDER_SITE_OTHER): Payer: Self-pay | Admitting: General Surgery

## 2012-06-12 ENCOUNTER — Ambulatory Visit (INDEPENDENT_AMBULATORY_CARE_PROVIDER_SITE_OTHER): Payer: Self-pay | Admitting: General Surgery

## 2012-06-12 VITALS — BP 150/80 | HR 97 | Temp 97.4°F | Resp 20 | Ht 69.0 in | Wt 238.8 lb

## 2012-06-12 DIAGNOSIS — K810 Acute cholecystitis: Secondary | ICD-10-CM | POA: Insufficient documentation

## 2012-06-12 HISTORY — DX: Acute cholecystitis: K81.0

## 2012-06-12 NOTE — Progress Notes (Signed)
[facility] July 04, 1965 [id number] 06/12/2012   Derek Pierce is a 47 y.o. male who had a laparoscopic cholecystectomy with intraoperative cholangiogram by Dr. Lindie Spruce.  The pathology report confirmed CHOLECYSTITIS.  The patient reports that they are feeling well with normal bowel movements and good appetite.  The pre-operative symptoms of abdominal pain, nausea, and vomiting have resolved.    Physical examination - Incisions appear well-healed with no sign of infection or bleeding.   Abdomen - soft, non-tender  Impression:  s/p laparoscopic cholecystectomy  Plan:  He may resume a regular diet and full activity.  He may follow-up on a PRN basis.

## 2012-06-12 NOTE — Patient Instructions (Signed)
Keep sites clean and wash with soap and water.  You can return to work.  Call if you have any further problems.

## 2015-01-16 ENCOUNTER — Ambulatory Visit (INDEPENDENT_AMBULATORY_CARE_PROVIDER_SITE_OTHER): Payer: Self-pay | Admitting: Family Medicine

## 2015-01-16 VITALS — BP 140/80 | HR 99 | Temp 100.2°F | Resp 16 | Ht 71.0 in | Wt 243.0 lb

## 2015-01-16 DIAGNOSIS — R61 Generalized hyperhidrosis: Secondary | ICD-10-CM

## 2015-01-16 DIAGNOSIS — R509 Fever, unspecified: Secondary | ICD-10-CM

## 2015-01-16 LAB — POCT URINALYSIS DIP (MANUAL ENTRY)
Blood, UA: NEGATIVE
Glucose, UA: NEGATIVE
Leukocytes, UA: NEGATIVE
Nitrite, UA: NEGATIVE
Protein Ur, POC: 100 — AB
Spec Grav, UA: >=1.03
Urobilinogen, UA: 1
pH, UA: 5.5

## 2015-01-16 LAB — POCT CBC
Granulocyte percent: 76.3 %G (ref 37–80)
HCT, POC: 45.3 % (ref 43.5–53.7)
Hemoglobin: 15.9 g/dL (ref 14.1–18.1)
Lymph, poc: 1 (ref 0.6–3.4)
MCH, POC: 29.4 pg (ref 27–31.2)
MCHC: 35.2 g/dL (ref 31.8–35.4)
MCV: 83.7 fL (ref 80–97)
MID (cbc): 1.1 — AB (ref 0–0.9)
MPV: 7.9 fL (ref 0–99.8)
POC Granulocyte: 6.6 (ref 2–6.9)
POC LYMPH PERCENT: 11.6 %L (ref 10–50)
POC MID %: 12.1 %M — AB (ref 0–12)
Platelet Count, POC: 168 10*3/uL (ref 142–424)
RBC: 5.41 M/uL (ref 4.69–6.13)
RDW, POC: 13.1 %
WBC: 8.7 10*3/uL (ref 4.6–10.2)

## 2015-01-16 LAB — POCT INFLUENZA A/B
Influenza A, POC: NEGATIVE
Influenza B, POC: NEGATIVE

## 2015-01-16 MED ORDER — DOXYCYCLINE HYCLATE 100 MG PO TABS: 100.0000 mg | ORAL_TABLET | Freq: Two times a day (BID) | ORAL | Status: DC

## 2015-01-16 NOTE — Progress Notes (Addendum)
Subjective:    Patient ID: Derek Pierce, male    DOB: 10-10-1965, 49 y.o.   MRN: [id number] This chart was scribed for Elvina Sidle, MD by Littie Deeds, Medical Scribe. This patient was seen in Room 14 and the patient's care was started at 12:25 PM.   HPI HPI Comments: Derek Pierce is a 49 y.o. male who presents to the [facility] complaining of a headache that started 3 days ago. Patient also reports having associated diaphoresis, rigors, and chills. He also reports having a fever that started last night. He took some tylenol about 2 hours ago. Patient denies abdominal pain, chest pain, vomiting, diarrhea, cough, rhinorrhea, and sore throat. PSHx includes cholecystectomy.  Patient works at the [employer].  Review of Systems  Constitutional: Positive for fever, chills and diaphoresis.  HENT: Negative for rhinorrhea and sore throat.   Respiratory: Negative for cough.   Cardiovascular: Negative for chest pain.  Gastrointestinal: Negative for vomiting, abdominal pain and diarrhea.  Neurological: Positive for headaches.       Objective:   Physical Exam CONSTITUTIONAL: Well developed/well nourished. Diaphoretic. HEAD: Normocephalic/atraumatic EYES: EOM/PERRL ENMT: Mucous membranes moist NECK: supple no meningeal signs SPINE: entire spine nontender CV: S1/S2 noted, no murmurs/rubs/gallops noted LUNGS: Lungs are clear to auscultation bilaterally, no apparent distress ABDOMEN: soft, nontender, no rebound or guarding GU: no cva tenderness NEURO: Pt is awake/alert, moves all extremitiesx4 EXTREMITIES: pulses normal, full ROM SKIN: warm, color normal PSYCH: no abnormalities of mood noted  Results for orders placed or performed in visit on 01/16/15  POCT CBC  Result Value Ref Range   WBC 8.7 4.6 - 10.2 K/uL   Lymph, poc 1.0 0.6 - 3.4   POC LYMPH PERCENT 11.6 10 - 50 %L   MID (cbc) 1.1 (A) 0 - 0.9   POC MID % 12.1 (A) 0 - 12 %M   POC Granulocyte  6.6 2 - 6.9   Granulocyte percent 76.3 37 - 80 %G   RBC 5.41 4.69 - 6.13 M/uL   Hemoglobin 15.9 14.1 - 18.1 g/dL   HCT, POC 01.0 27.2 - 53.7 %   MCV 83.7 80 - 97 fL   MCH, POC 29.4 27 - 31.2 pg   MCHC 35.2 31.8 - 35.4 g/dL   RDW, POC 53.6 %   Platelet Count, POC 168 142 - 424 K/uL   MPV 7.9 0 - 99.8 fL  POCT Influenza A/B  Result Value Ref Range   Influenza A, POC Negative Negative   Influenza B, POC Negative Negative  POCT urinalysis dipstick  Result Value Ref Range   Color, UA orange (A) yellow   Clarity, UA clear clear   Glucose, UA negative negative   Bilirubin, UA small (A) negative   Ketones, POC UA trace (5) (A) negative   Spec Grav, UA >=1.030    Blood, UA negative negative   pH, UA 5.5    Protein Ur, POC =100 (A) negative   Urobilinogen, UA 1.0    Nitrite, UA Negative Negative   Leukocytes, UA Negative Negative    Results for orders placed or performed in visit on 01/16/15  POCT CBC  Result Value Ref Range   WBC 8.7 4.6 - 10.2 K/uL   Lymph, poc 1.0 0.6 - 3.4   POC LYMPH PERCENT 11.6 10 - 50 %L   MID (cbc) 1.1 (A) 0 - 0.9   POC MID % 12.1 (A) 0 - 12 %M   POC  Granulocyte 6.6 2 - 6.9   Granulocyte percent 76.3 37 - 80 %G   RBC 5.41 4.69 - 6.13 M/uL   Hemoglobin 15.9 14.1 - 18.1 g/dL   HCT, POC 13.045.3 86.543.5 - 53.7 %   MCV 83.7 80 - 97 fL   MCH, POC 29.4 27 - 31.2 pg   MCHC 35.2 31.8 - 35.4 g/dL   RDW, POC 78.413.1 %   Platelet Count, POC 168 142 - 424 K/uL   MPV 7.9 0 - 99.8 fL  POCT Influenza A/B  Result Value Ref Range   Influenza A, POC Negative Negative   Influenza B, POC Negative Negative  POCT urinalysis dipstick  Result Value Ref Range   Color, UA orange (A) yellow   Clarity, UA clear clear   Glucose, UA negative negative   Bilirubin, UA small (A) negative   Ketones, POC UA trace (5) (A) negative   Spec Grav, UA >=1.030    Blood, UA negative negative   pH, UA 5.5    Protein Ur, POC =100 (A) negative   Urobilinogen, UA 1.0    Nitrite, UA Negative  Negative   Leukocytes, UA Negative Negative        Assessment & Plan:   By signing my name below, I, Littie Deedsichard Sun, attest that this documentation has been prepared under the direction and in the presence of Elvina SidleKurt Lauenstein, MD.  Electronically Signed: Littie Deedsichard Sun, Medical Scribe. 01/16/2015. 12:32 PM.  This chart was scribed in my presence and reviewed by me personally.    ICD-9-CM ICD-10-CM   1. Fever and chills 780.60 R50.9 POCT CBC     POCT Influenza A/B     POCT urinalysis dipstick     Rocky mtn spotted fvr ab, IgM-blood  2. Diaphoresis 780.8 R61 POCT CBC     POCT Influenza A/B     POCT urinalysis dipstick     Rocky mtn spotted fvr ab, IgM-blood     Signed, Elvina SidleKurt Lauenstein, MD

## 2015-01-16 NOTE — Patient Instructions (Signed)
The tests do not show the flu or any urinary infection. Instead, the blood tests suggest that patient has an acute infection of a different kind. [place] we must rule [facility] fever and the test for this is being run. We should have an answer and a couple days. At this point, I am calling in an antibiotic which she should take with Tylenol or ibuprofen until we get the results of the [facility] fever test.

## 2015-01-19 LAB — ROCKY MTN SPOTTED FVR AB, IGM-BLOOD: ROCKY MTN SPOTTED FEVER, IGM: 0.33 IV

## 2015-03-28 ENCOUNTER — Ambulatory Visit (INDEPENDENT_AMBULATORY_CARE_PROVIDER_SITE_OTHER): Payer: Self-pay | Admitting: Internal Medicine

## 2015-03-28 VITALS — BP 152/90 | HR 89 | Temp 98.1°F | Resp 16 | Ht 70.0 in | Wt 248.8 lb

## 2015-03-28 DIAGNOSIS — R319 Hematuria, unspecified: Secondary | ICD-10-CM

## 2015-03-28 LAB — POCT URINALYSIS DIP (MANUAL ENTRY)
Bilirubin, UA: NEGATIVE
Glucose, UA: NEGATIVE
Leukocytes, UA: NEGATIVE
Nitrite, UA: NEGATIVE
RBC UA: NEGATIVE
SPEC GRAV UA: 1.025
UROBILINOGEN UA: 0.2
pH, UA: 5.5

## 2015-03-28 LAB — POC MICROSCOPIC URINALYSIS (UMFC)

## 2015-03-28 NOTE — Patient Instructions (Signed)

## 2015-03-28 NOTE — Progress Notes (Signed)
   Subjective:  By signing my name below, I, Derek Pierce, attest that this documentation has been prepared under the direction and in the presence of Derek Siaobert Doolittle, MD.  Derek Pierce, Medical Scribe. 03/28/2015.  2:55 PM.  I have completed the patient encounter in its entirety as documented by the scribe, with editing by me where necessary. Derek Pierce, M.D.    Patient ID: Derek Pierce, male    DOB: 09/19/1965, 49 y.o.   MRN: [id number]  Chief Complaint  Patient presents with  . Hematuria    HPI HPI Comments: Derek Pierce is a 49 y.o. male who presents to [facility] complaining of hematuria, sudden onset last night. He denies associate dysuria, abdominal pain, urinary frequency. Pt reports no hx of kidney stones, any medical condition or problems. He is not currently on any medications.     Patient Active Problem List   Diagnosis Date Noted  . Cholecystitis, acute 06/12/2012   Past Medical History  Diagnosis Date  . Cholecystitis, acute 06/12/2012   Past Surgical History  Procedure Laterality Date  . Cholecystectomy N/A 05/20/2012    Procedure: LAPAROSCOPIC CHOLECYSTECTOMY WITH INTRAOPERATIVE CHOLANGIOGRAM;  Surgeon: Derek RidgesJames O Wyatt, MD;  Location: [facility] OR;  Service: General;  Laterality: N/A;   No Known Allergies Prior to Admission medications   Medication Sig Start Date End Date Taking? Authorizing Provider  doxycycline (VIBRA-TABS) 100 MG tablet Take 1 tablet (100 mg total) by mouth 2 (two) times daily. Patient not taking: Reported on 03/28/2015 01/16/15   Derek SidleKurt Lauenstein, MD   Social History   Social History  . Marital Status: Married    Spouse Name: N/A  . Number of Children: N/A  . Years of Education: N/A   Occupational History  . Not on file.   Social History Main Topics  . Smoking status: Former Smoker    Types: Cigarettes    Quit date: 04/05/2007  . Smokeless tobacco: Not on file  . Alcohol Use: No  . Drug  Use: No  . Sexual Activity: Not on file   Other Topics Concern  . Not on file   Social History Narrative    Review of Systems  Gastrointestinal: Negative for abdominal pain.  Genitourinary: Positive for hematuria. Negative for dysuria and frequency.      Objective:   Physical Exam  Constitutional: He is oriented to person, place, and time. He appears well-developed and well-nourished. No distress.  HENT:  Head: Normocephalic and atraumatic.  Eyes: EOM are normal. Pupils are equal, round, and reactive to light.  Neck: Neck supple.  Cardiovascular: Normal rate.   Pulmonary/Chest: Effort normal.  Neurological: He is alert and oriented to person, place, and time. No cranial nerve deficit.  Skin: Skin is warm and dry.  Psychiatric: He has a normal mood and affect. His behavior is normal.  Nursing note and vitals reviewed. no CVA tend to perc  BP 152/90 mmHg  Pulse 89  Temp(Src) 98.1 F (36.7 C) (Oral)  Resp 16  Ht 5\' 10"  (1.778 m)  Wt 248 lb 12.8 oz (112.855 kg)  BMI 35.70 kg/m2  SpO2 97%     Assessment & Plan:  Single episode gross hematuria ? etio Handout given Set up CPE at checkout and repeat U/A then

## 2015-08-14 ENCOUNTER — Ambulatory Visit (INDEPENDENT_AMBULATORY_CARE_PROVIDER_SITE_OTHER): Payer: BLUE CROSS/BLUE SHIELD | Admitting: Family Medicine

## 2015-08-14 VITALS — BP 124/86 | HR 85 | Temp 98.3°F | Resp 18 | Ht 70.0 in | Wt 245.0 lb

## 2015-08-14 DIAGNOSIS — Z23 Encounter for immunization: Secondary | ICD-10-CM

## 2015-08-14 NOTE — Patient Instructions (Signed)
     IF you received an x-ray today, you will receive an invoice from [facility]. Please contact [facility] at [phone] with questions or concerns regarding your invoice.   IF you received labwork today, you will receive an invoice from [facility]. Please contact [facility] at [phone] with questions or concerns regarding your invoice.   Our billing staff will not be able to assist you with questions regarding bills from these companies.  You will be contacted with the lab results as soon as they are available. The fastest way to get your results is to activate your My Chart account. Instructions are located on the last page of this paperwork. If you have not heard from us regarding the results in 2 weeks, please contact this office.      

## 2015-08-17 NOTE — Progress Notes (Signed)
   Subjective:    Patient ID: Derek Pierce, male    DOB: 11/24/1965, 50 y.o.   MRN: [id number]  HPI This is a pleasant 50 yo male who is accompanied by his wife, son and grandson. He speaks Arabic. His son helps with translating when needed. The patient is going on [place] to [place] in 4 months and is requesting a meningitis vaccine. He reports he is up to date on all other requirements.   Past Medical History  Diagnosis Date  . Cholecystitis, acute 06/12/2012   Past Surgical History  Procedure Laterality Date  . Cholecystectomy N/A 05/20/2012    Procedure: LAPAROSCOPIC CHOLECYSTECTOMY WITH INTRAOPERATIVE CHOLANGIOGRAM;  Surgeon: Cherylynn RidgesJames O Wyatt, MD;  [facility];  Service: General;  Laterality: N/A;   No family history on file. Social History  Substance Use Topics  . Smoking status: Former Smoker    Types: Cigarettes    Quit date: 04/05/2007  . Smokeless tobacco: None  . Alcohol Use: No     Review of Systems Per HPI    Objective:   Physical Exam Physical Exam  Constitutional: Oriented to person, place, and time. He appears well-developed and well-nourished.  HENT:  Head: Normocephalic and atraumatic.  Eyes: Conjunctivae are normal.  Neck: Normal range of motion. Neck supple.  Cardiovascular: Normal rate, regular rhythm and normal heart sounds.   Pulmonary/Chest: Effort normal and breath sounds normal.  Musculoskeletal: Normal range of motion.  Neurological: Alert and oriented to person, place, and time.  Skin: Skin is warm and dry.  Psychiatric: Normal mood and affect. Behavior is normal. Judgment and thought content normal.  Vitals reviewed.  BP 124/86 mmHg  Pulse 85  Temp(Src) 98.3 F (36.8 C) (Oral)  Resp 18  Ht 5\' 10"  (1.778 m)  Wt 245 lb (111.131 kg)  BMI 35.15 kg/m2  SpO2 97%     Assessment & Plan:  1. Need for meningococcal vaccination - Meningococcal conjugate vaccine 4-valent IM - reviewed CDC guidelines for appropriateness of Menveo to meet  requiements  Olean Reeeborah Gessner, FNP-BC  [facility]  08/17/2015 9:53 PM

## 2017-02-03 ENCOUNTER — Encounter: Payer: Self-pay | Admitting: Physician Assistant

## 2017-02-03 ENCOUNTER — Ambulatory Visit (INDEPENDENT_AMBULATORY_CARE_PROVIDER_SITE_OTHER): Payer: BLUE CROSS/BLUE SHIELD | Admitting: Physician Assistant

## 2017-02-03 VITALS — BP 167/96 | HR 86 | Temp 98.4°F | Resp 16 | Ht 68.0 in | Wt 240.2 lb

## 2017-02-03 DIAGNOSIS — I10 Essential (primary) hypertension: Secondary | ICD-10-CM

## 2017-02-03 DIAGNOSIS — R030 Elevated blood-pressure reading, without diagnosis of hypertension: Secondary | ICD-10-CM

## 2017-02-03 HISTORY — DX: Essential (primary) hypertension: I10

## 2017-02-03 MED ORDER — LISINOPRIL 10 MG PO TABS: 10.0000 mg | ORAL_TABLET | Freq: Every day | ORAL | 3 refills | Status: DC

## 2017-02-03 NOTE — Progress Notes (Signed)
   El BlencoeMofaddal Vigorito  MRN: [id number] DOB: 02/28/1966  PCP: Default, Provider, MD  Subjective:  Pt is a 51 year old male who presents to clinic for elevated blood pressure.  He is applying for a new job and had a health screening. He was told his blood pressure was elevated and needs evaluation. Blood pressure today is 140/100, recheck is 167/96. Has never been diagnoses with HTN before.  Does not exercise. Does not eat healthy diet.  Denies headache, vision changes, chest pain, chest pressure.   Review of Systems  Constitutional: Negative for chills, diaphoresis, fatigue and fever.  Respiratory: Negative for cough, chest tightness, shortness of breath and wheezing.   Cardiovascular: Negative for chest pain, palpitations and leg swelling.  Gastrointestinal: Negative for diarrhea, nausea and vomiting.  Musculoskeletal: Negative for neck pain.  Neurological: Negative for dizziness, syncope, light-headedness and headaches.  Psychiatric/Behavioral: Negative for sleep disturbance. The patient is not nervous/anxious.     Patient Active Problem List   Diagnosis Date Noted  . Cholecystitis, acute 06/12/2012    No current outpatient prescriptions on file prior to visit.   No current facility-administered medications on file prior to visit.     No Known Allergies   Objective:  BP (!) 167/96   Pulse 86   Temp 98.4 F (36.9 C) (Oral)   Resp 16   Ht 5\' 8"  (1.727 m)   Wt 240 lb 3.2 oz (109 kg)   SpO2 96%   BMI 36.52 kg/m   Physical Exam  Constitutional: He is oriented to person, place, and time and well-developed, well-nourished, and in no distress. No distress.  Neck: Carotid bruit is not present.  Cardiovascular: Normal rate, regular rhythm and normal heart sounds.   Musculoskeletal:       Right lower leg: He exhibits edema (1+).       Left lower leg: He exhibits edema (1+).  Neurological: He is alert and oriented to person, place, and time. GCS score is 15.  Skin: Skin is  warm and dry.  Psychiatric: Mood, memory, affect and judgment normal.  Vitals reviewed.   Assessment and Plan :  1. Elevated blood pressure reading 2. Essential hypertension - lisinopril (PRINIVIL,ZESTRIL) 10 MG tablet; Take 1 tablet (10 mg total) by mouth daily.  Dispense: 90 tablet; Refill: 3 - Pt asked by employer to present to clinic for high blood pressure reading Today's blood pressure is 140/100, recheck is 167/96. He is asymptomatic. Will start lisinopril today. RTC in 2-3 weeks for recheck. Plan to draw routine labs at that time. Note written for work discussing plan.   Marco CollieWhitney Chi Woodham, PA-C  [facility] 02/03/2017 6:07 PM

## 2017-02-03 NOTE — Patient Instructions (Addendum)
Start your Lisinopril. Take this once daily.  Please see below for information about hypertension. High blood pressure is lowered with lifestyle changes including eating a healthy diet and getting regular exercise. Start making small changes in your lifestyle.  Come back and see me in 2-3 weeks. We will do routine blood work at that visit.   Thank you for coming in today. I hope you feel we met your needs.  Feel free to call PCP if you have any questions or further requests. Please consider signing up for MyChart if you do not already have it, as this is a great way to communicate with me.  Best,  Whitney McVey, PA-C   Hypertension Hypertension is another name for high blood pressure. High blood pressure forces your heart to work harder to pump blood. This can cause problems over time. There are two numbers in a blood pressure reading. There is a top number (systolic) over a bottom number (diastolic). It is best to have a blood pressure below 120/80. Healthy choices can help lower your blood pressure. You may need medicine to help lower your blood pressure if:  Your blood pressure cannot be lowered with healthy choices.  Your blood pressure is higher than 130/80.  Follow these instructions at home: Eating and drinking If directed, follow the DASH eating plan. (SEE BELOW) Lifestyle  Work with your doctor to stay at a healthy weight or to lose weight. Ask your doctor what the best weight is for you.  Get at least 30 minutes of exercise that causes your heart to beat faster (aerobic exercise) most days of the week. This may include walking, swimming, or biking.  Get at least 30 minutes of exercise that strengthens your muscles (resistance exercise) at least 3 days a week. This may include lifting weights or pilates.  Do not use any products that contain nicotine or tobacco. This includes cigarettes and e-cigarettes. If you need help quitting, ask your doctor.  Check your blood pressure at  home as told by your doctor.  Keep all follow-up visits as told by your doctor. This is important. Medicines  Take over-the-counter and prescription medicines only as told by your doctor. Follow directions carefully.  Do not skip doses of blood pressure medicine. The medicine does not work as well if you skip doses. Skipping doses also puts you at risk for problems.  Ask your doctor about side effects or reactions to medicines that you should watch for. Contact a doctor if:  You think you are having a reaction to the medicine you are taking.  You have headaches that keep coming back (recurring).  You feel dizzy.  You have swelling in your ankles.  You have trouble with your vision. Get help right away if:  You get a very bad headache.  You start to feel confused.  You feel weak or numb.  You feel faint.  You get very bad pain in your: ? Chest. ? Belly (abdomen).  You throw up (vomit) more than once.  You have trouble breathing. Summary  Hypertension is another name for high blood pressure.  Making healthy choices can help lower blood pressure. If your blood pressure cannot be controlled with healthy choices, you may need to take medicine. This information is not intended to replace advice given to you by your health care provider. Make sure you discuss any questions you have with your health care provider. Document Released: 09/07/2007 Document Revised: 02/17/2016 Document Reviewed: 02/17/2016 Elsevier Interactive Patient Education  2018 [place] Eating Plan DASH stands for "Dietary Approaches to Stop Hypertension." The DASH eating plan is a healthy eating plan that has been shown to reduce high blood pressure (hypertension). It may also reduce your risk for type 2 diabetes, heart disease, and stroke. The DASH eating plan may also help with weight loss. What are tips for following this plan? General guidelines  Avoid eating more than 2,300 mg  (milligrams) of salt (sodium) a day. If you have hypertension, you may need to reduce your sodium intake to 1,500 mg a day.  Limit alcohol intake to no more than 1 drink a day for nonpregnant women and 2 drinks a day for men. One drink equals 12 oz of beer, 5 oz of wine, or 1 oz of hard liquor.  Work with your health care provider to maintain a healthy body weight or to lose weight. Ask what an ideal weight is for you.  Get at least 30 minutes of exercise that causes your heart to beat faster (aerobic exercise) most days of the week. Activities may include walking, swimming, or biking.  Work with your health care provider or diet and nutrition specialist (dietitian) to adjust your eating plan to your individual calorie needs. Reading food labels  Check food labels for the amount of sodium per serving. Choose foods with less than 5 percent of the Daily Value of sodium. Generally, foods with less than 300 mg of sodium per serving fit into this eating plan.  To find whole grains, look for the word "whole" as the first word in the ingredient list. Shopping  Buy products labeled as "low-sodium" or "no salt added."  Buy fresh foods. Avoid canned foods and premade or frozen meals. Cooking  Avoid adding salt when cooking. Use salt-free seasonings or herbs instead of table salt or sea salt. Check with your health care provider or pharmacist before using salt substitutes.  Do not fry foods. Cook foods using healthy methods such as baking, boiling, grilling, and broiling instead.  Cook with heart-healthy oils, such as olive, canola, soybean, or sunflower oil. Meal planning   Eat a balanced diet that includes: ? 5 or more servings of fruits and vegetables each day. At each meal, try to fill half of your plate with fruits and vegetables. ? Up to 6-8 servings of whole grains each day. ? Less than 6 oz of lean meat, poultry, or fish each day. A 3-oz serving of meat is about the same size as a deck  of cards. One egg equals 1 oz. ? 2 servings of low-fat dairy each day. ? A serving of nuts, seeds, or beans 5 times each week. ? Heart-healthy fats. Healthy fats called Omega-3 fatty acids are found in foods such as flaxseeds and coldwater fish, like sardines, salmon, and mackerel.  Limit how much you eat of the following: ? Canned or prepackaged foods. ? Food that is high in trans fat, such as fried foods. ? Food that is high in saturated fat, such as fatty meat. ? Sweets, desserts, sugary drinks, and other foods with added sugar. ? Full-fat dairy products.  Do not salt foods before eating.  Try to eat at least 2 vegetarian meals each week.  Eat more home-cooked food and less restaurant, buffet, and fast food.  When eating at a restaurant, ask that your food be prepared with less salt or no salt, if possible. What foods are recommended? The items listed may not be a complete list.  Talk with your dietitian about what dietary choices are best for you. Grains Whole-grain or whole-wheat bread. Whole-grain or whole-wheat pasta. Brown rice. [name]. Bulgur. Whole-grain and low-sodium cereals. Pita bread. Low-fat, low-sodium crackers. Whole-wheat flour tortillas. Vegetables Fresh or frozen vegetables (raw, steamed, roasted, or grilled). Low-sodium or reduced-sodium tomato and vegetable juice. Low-sodium or reduced-sodium tomato sauce and tomato paste. Low-sodium or reduced-sodium canned vegetables. Fruits All fresh, dried, or frozen fruit. Canned fruit in natural juice (without added sugar). Meat and other protein foods Skinless chicken or [place]. Ground chicken or [place]. Pork with fat trimmed off. Fish and seafood. Egg whites. Dried beans, peas, or lentils. Unsalted nuts, nut butters, and seeds. Unsalted canned beans. Lean cuts of beef with fat trimmed off. Low-sodium, lean deli meat. Dairy Low-fat (1%) or fat-free (skim) milk. Fat-free, low-fat, or reduced-fat cheeses. Nonfat,  low-sodium ricotta or cottage cheese. Low-fat or nonfat yogurt. Low-fat, low-sodium cheese. Fats and oils Soft margarine without trans fats. Vegetable oil. Low-fat, reduced-fat, or light mayonnaise and salad dressings (reduced-sodium). Canola, safflower, olive, soybean, and sunflower oils. Avocado. Seasoning and other foods Herbs. Spices. Seasoning mixes without salt. Unsalted popcorn and pretzels. Fat-free sweets. What foods are not recommended? The items listed may not be a complete list. Talk with your dietitian about what dietary choices are best for you. Grains Baked goods made with fat, such as croissants, muffins, or some breads. Dry pasta or rice meal packs. Vegetables Creamed or fried vegetables. Vegetables in a cheese sauce. Regular canned vegetables (not low-sodium or reduced-sodium). Regular canned tomato sauce and paste (not low-sodium or reduced-sodium). Regular tomato and vegetable juice (not low-sodium or reduced-sodium). [name]. Olives. Fruits Canned fruit in a light or heavy syrup. Fried fruit. Fruit in cream or butter sauce. Meat and other protein foods Fatty cuts of meat. Ribs. Fried meat. [name]. Sausage. Bologna and other processed lunch meats. Salami. Fatback. Hotdogs. Bratwurst. Salted nuts and seeds. Canned beans with added salt. Canned or smoked fish. Whole eggs or egg yolks. Chicken or [place] with skin. Dairy Whole or 2% milk, cream, and half-and-half. Whole or full-fat cream cheese. Whole-fat or sweetened yogurt. Full-fat cheese. Nondairy creamers. Whipped toppings. Processed cheese and cheese spreads. Fats and oils Butter. Stick margarine. Lard. Shortening. Ghee. Bacon fat. Tropical oils, such as coconut, palm kernel, or palm oil. Seasoning and other foods Salted popcorn and pretzels. Onion salt, garlic salt, seasoned salt, table salt, and sea salt. Worcestershire sauce. Tartar sauce. Barbecue sauce. Teriyaki sauce. Soy sauce, including reduced-sodium. Steak sauce.  Canned and packaged gravies. Fish sauce. Oyster sauce. Cocktail sauce. Horseradish that you find on the shelf. Ketchup. Mustard. Meat flavorings and tenderizers. Bouillon cubes. Hot sauce and Tabasco sauce. Premade or packaged marinades. Premade or packaged taco seasonings. Relishes. Regular salad dressings. Where to find more information:  National Heart, Lung, and [place]: https://wilson-eaton.com/  American Heart Association: www.heart.org Summary  The DASH eating plan is a healthy eating plan that has been shown to reduce high blood pressure (hypertension). It may also reduce your risk for type 2 diabetes, heart disease, and stroke.  With the DASH eating plan, you should limit salt (sodium) intake to 2,300 mg a day. If you have hypertension, you may need to reduce your sodium intake to 1,500 mg a day.  When on the DASH eating plan, aim to eat more fresh fruits and vegetables, whole grains, lean proteins, low-fat dairy, and heart-healthy fats.  Work with your health care provider or diet and nutrition specialist (dietitian) to adjust  your eating plan to your individual calorie needs. This information is not intended to replace advice given to you by your health care provider. Make sure you discuss any questions you have with your health care provider. Document Released: 03/10/2011 Document Revised: 03/14/2016 Document Reviewed: 03/14/2016 Elsevier Interactive Patient Education  2017 [employer].   IF you received an x-ray today, you will receive an invoice from [facility]. Please contact [facility] at [phone] with questions or concerns regarding your invoice.   IF you received labwork today, you will receive an invoice from [facility]. Please contact [facility] at [phone] with questions or concerns regarding your invoice.   Our billing staff will not be able to assist you with questions regarding bills from these companies.  You will be contacted with the lab  results as soon as they are available. The fastest way to get your results is to activate your My Chart account. Instructions are located on the last page of this paperwork. If you have not heard from [place] regarding the results in 2 weeks, please contact this office.

## 2017-02-04 ENCOUNTER — Emergency Department (HOSPITAL_COMMUNITY): Payer: BLUE CROSS/BLUE SHIELD

## 2017-02-04 ENCOUNTER — Encounter (HOSPITAL_COMMUNITY): Payer: Self-pay

## 2017-02-04 ENCOUNTER — Emergency Department (HOSPITAL_COMMUNITY)
Admission: EM | Admit: 2017-02-04 | Discharge: 2017-02-04 | Disposition: A | Discharge status: Home / Self Care | Payer: BLUE CROSS/BLUE SHIELD | Attending: Emergency Medicine | Admitting: Emergency Medicine

## 2017-02-04 DIAGNOSIS — R519 Headache, unspecified: Secondary | ICD-10-CM

## 2017-02-04 DIAGNOSIS — I10 Essential (primary) hypertension: Secondary | ICD-10-CM | POA: Insufficient documentation

## 2017-02-04 DIAGNOSIS — Z87891 Personal history of nicotine dependence: Secondary | ICD-10-CM | POA: Insufficient documentation

## 2017-02-04 DIAGNOSIS — R51 Headache: Secondary | ICD-10-CM | POA: Diagnosis not present

## 2017-02-04 DIAGNOSIS — R112 Nausea with vomiting, unspecified: Secondary | ICD-10-CM

## 2017-02-04 HISTORY — DX: Essential (primary) hypertension: I10

## 2017-02-04 MED ORDER — ACETAMINOPHEN 500 MG PO TABS
1000.0000 mg | ORAL_TABLET | Freq: Once | ORAL | Status: AC
  Administered 2017-02-04: 1000 mg via ORAL
  Filled 2017-02-04: qty 2

## 2017-02-04 MED ORDER — DIPHENHYDRAMINE HCL 50 MG/ML IJ SOLN
25.0000 mg | Freq: Once | INTRAMUSCULAR | Status: AC
  Administered 2017-02-04: 25 mg via INTRAVENOUS
  Filled 2017-02-04: qty 1

## 2017-02-04 MED ORDER — DEXAMETHASONE SODIUM PHOSPHATE 10 MG/ML IJ SOLN
10.0000 mg | Freq: Once | INTRAMUSCULAR | Status: AC
  Administered 2017-02-04: 10 mg via INTRAVENOUS
  Filled 2017-02-04: qty 1

## 2017-02-04 MED ORDER — KETOROLAC TROMETHAMINE 30 MG/ML IJ SOLN
30.0000 mg | Freq: Once | INTRAMUSCULAR | Status: AC
  Administered 2017-02-04: 30 mg via INTRAVENOUS
  Filled 2017-02-04: qty 1

## 2017-02-04 MED ORDER — PROCHLORPERAZINE EDISYLATE 5 MG/ML IJ SOLN
10.0000 mg | Freq: Once | INTRAMUSCULAR | Status: AC
  Administered 2017-02-04: 10 mg via INTRAVENOUS
  Filled 2017-02-04: qty 2

## 2017-02-04 NOTE — ED Notes (Signed)
Pt returned from CT at this time.  

## 2017-02-04 NOTE — ED Triage Notes (Signed)
Pt seen at [facility] yesterday for headache.  Pt started Lisinopril 10 mg qd today, 30 min later pt vomited 3-4.  Pt still c/o headache.

## 2017-02-04 NOTE — ED Provider Notes (Signed)
[facility] EMERGENCY DEPARTMENT Provider Note   CSN: [id number] Arrival date & time: 02/04/17  1234     History   Chief Complaint Chief Complaint  Patient presents with  . Headache  . Emesis    HPI Derek Pierce is a 51 y.o. male.  The history is provided by the patient and medical records. The history is limited by a language barrier. A language interpreter was used.  Headache   This is a new problem. The current episode started yesterday. The problem occurs constantly. The problem has not changed since onset.The headache is associated with bright light. The pain is located in the frontal region. The quality of the pain is described as dull. The pain is moderate. The pain does not radiate. Associated symptoms include nausea and vomiting. Pertinent negatives include no fever, no malaise/fatigue, no chest pressure, no near-syncope, no palpitations, no syncope and no shortness of breath. He has tried nothing for the symptoms. The treatment provided no relief.    Past Medical History:  Diagnosis Date  . Cholecystitis, acute 06/12/2012  . Hypertension 02/03/2017    Patient Active Problem List   Diagnosis Date Noted  . Cholecystitis, acute 06/12/2012    Past Surgical History:  Procedure Laterality Date  . CHOLECYSTECTOMY N/A 05/20/2012   Procedure: LAPAROSCOPIC CHOLECYSTECTOMY WITH INTRAOPERATIVE CHOLANGIOGRAM;  Surgeon: Cherylynn Ridges, MD;  Location: [facility] OR;  Service: General;  Laterality: N/A;       Home Medications    Prior to Admission medications   Medication Sig Start Date End Date Taking? Authorizing Provider  lisinopril (PRINIVIL,ZESTRIL) 10 MG tablet Take 1 tablet (10 mg total) by mouth daily. 02/03/17   McVey, Madelaine Bhat, PA-C    Family History History reviewed. No pertinent family history.  Social History Social History  Substance Use Topics  . Smoking status: Former Smoker    Types: Cigarettes    Quit date: 04/05/2007  .  Smokeless tobacco: Never Used  . Alcohol use No     Allergies   Patient has no known allergies.   Review of Systems Review of Systems  Constitutional: Negative for chills, diaphoresis, fatigue, fever and malaise/fatigue.  HENT: Negative for congestion.   Eyes: Negative for visual disturbance.  Respiratory: Negative for cough, chest tightness, shortness of breath, wheezing and stridor.   Cardiovascular: Negative for palpitations, syncope and near-syncope.  Gastrointestinal: Positive for nausea and vomiting. Negative for abdominal pain, constipation and diarrhea.  Genitourinary: Negative for flank pain.  Musculoskeletal: Negative for arthralgias, neck pain and neck stiffness.  Skin: Negative for rash and wound.  Neurological: Positive for headaches. Negative for tremors, seizures, syncope, speech difficulty, weakness, light-headedness and numbness.  Psychiatric/Behavioral: Negative for agitation.  All other systems reviewed and are negative.    Physical Exam Updated Vital Signs BP (!) 170/125 (BP Location: Left Arm)   Pulse 77   Temp 97.6 F (36.4 C) (Oral)   Resp 18   SpO2 99%   Physical Exam  Constitutional: He is oriented to person, place, and time. He appears well-developed and well-nourished. No distress.  HENT:  Head: Normocephalic and atraumatic.  Mouth/Throat: Oropharynx is clear and moist. No oropharyngeal exudate.  Eyes: Pupils are equal, round, and reactive to light. Conjunctivae and EOM are normal.  Neck: Normal range of motion. Neck supple.  Cardiovascular: Normal rate and intact distal pulses.   No murmur heard. Pulmonary/Chest: Effort normal and breath sounds normal. No stridor. No respiratory distress. He has no wheezes. He exhibits  no tenderness.  Abdominal: Soft. He exhibits no distension. There is no tenderness.  Musculoskeletal: He exhibits no edema or tenderness.  Neurological: He is alert and oriented to person, place, and time. He is not  disoriented. He displays no tremor. No cranial nerve deficit or sensory deficit. He exhibits normal muscle tone. Coordination normal.  Skin: Skin is warm and dry. He is not diaphoretic. No pallor.  Psychiatric: He has a normal mood and affect.  Nursing note and vitals reviewed.    ED Treatments / Results  Labs (all labs ordered are listed, but only abnormal results are displayed) Labs Reviewed - No data to display  EKG  EKG Interpretation  Date/Time:  Saturday February 04 2017 13:26:18 EDT Ventricular Rate:  70 PR Interval:    QRS Duration: 102 QT Interval:  401 QTC Calculation: 433 R Axis:   14 Text Interpretation:  Sinus rhythm RSR' in V1 or V2, probably normal variant ST elev, probable normal early repol pattern No prior ECG for comparison.  No STEMI Confirmed by Theda Belfastegeler, Chris ([id number]) on 02/04/2017 1:32:36 PM       Radiology Ct Head Wo Contrast  Result Date: 02/04/2017 CLINICAL DATA:  51 year old male with headache. EXAM: CT HEAD WITHOUT CONTRAST TECHNIQUE: Contiguous axial images were obtained from the base of the skull through the vertex without intravenous contrast. COMPARISON:  None. FINDINGS: Brain: No evidence of acute infarction, hemorrhage, hydrocephalus, extra-axial collection or mass lesion/mass effect. Vascular: No hyperdense vessel or unexpected calcification. Skull: Normal. Negative for fracture or focal lesion. Sinuses/Orbits: Mucosal thickening of the ethmoid air cells bilaterally. Remaining paranasal sinuses and mastoid air cells are clear. The orbits are symmetric and intact. Other: None. IMPRESSION: 1. No acute intracranial pathology. 2. Sinus disease as detailed. Electronically Signed   By: Sande BrothersSerena  Chacko M.D.   On: 02/04/2017 14:53    Procedures Procedures (including critical care time)  Medications Ordered in ED Medications  prochlorperazine (COMPAZINE) injection 10 mg (10 mg Intravenous Given 02/04/17 1455)  diphenhydrAMINE (BENADRYL) injection 25 mg (25  mg Intravenous Given 02/04/17 1455)  acetaminophen (TYLENOL) tablet 1,000 mg (1,000 mg Oral Given 02/04/17 1456)  ketorolac (TORADOL) 30 MG/ML injection 30 mg (30 mg Intravenous Given 02/04/17 1636)  dexamethasone (DECADRON) injection 10 mg (10 mg Intravenous Given 02/04/17 1636)     Initial Impression / Assessment and Plan / ED Course  I have reviewed the triage vital signs and the nursing notes.  Pertinent labs & imaging results that were available during my care of the patient were reviewed by me and considered in my medical decision making (see chart for details).     Derek Pierce is a 51 y.o. male with a past medical history of headaches and hypertension recently started on lisinopril who presents with headache, nausea, vomiting, and elevated blood pressure.  Patient reports that he was diagnosed with hypertension recently.  Says that for the last few days he has had nausea and headaches.  He reports that the bright lights of made headaches worse.  He says that he has had history of headaches feel similar but this has worsened.  He says that the headache is in his frontal headache and is aching.  He denies recent traumatic injuries.  He denies fevers, chills, neck pain or neck stiffness.  He denies any vision changes, constipation, diarrhea, dysuria.  He denies chest pain, shortness breath, cough, or congestion.  He denies any sick contacts.    On exam, patient has no focal neurologic deficits.  Patient has normal sensation and strength in all extremity's.  No facial droop.  Normal speech.  Pupils are reactive with normal extra ocular movements.  No neck stiffness.  Full range of motion of neck.  No rashes seen.  Lungs clear chest nontender.  Abdomen nontender.  Patient's blood pressure was initially in the 160s and 170s.  Given his report of worsened headache, patient will have CT of the head to look for intracranial hemorrhage.  Patient will be given headache cocktail as a suspect his  chronic headaches of been worsened by his elevated blood pressure.  Patient had CT head showing no acute hemorrhage.  Patient reported a headache was improving after headache cocktail.  Patient advised to continue taking his blood pressure medicine and follow-up with his PCP for reassessment.  Based on reassuring exam and improving headache and his mildly elevated blood pressure in the 130s, do not feel patient has subarachnoid hemorrhage.  Discussion held with patient of lumbar puncture to look for xanthochromia however, after discussion, decision was made to treat his headaches as an outpatient.  Patient will follow-up with PCP and understood return precautions.  Patient discharged in good condition with improving symptoms.    Final Clinical Impressions(s) / ED Diagnoses   Final diagnoses:  Nonintractable headache, unspecified chronicity pattern, unspecified headache type  Non-intractable vomiting with nausea, unspecified vomiting type    New Prescriptions New Prescriptions   No medications on file    Clinical Impression: 1. Nonintractable headache, unspecified chronicity pattern, unspecified headache type   2. Non-intractable vomiting with nausea, unspecified vomiting type     Disposition: Discharge  Condition: Good  I have discussed the results, Dx and Tx plan with the pt(& family if present). He/she/they expressed understanding and agree(s) with the plan. Discharge instructions discussed at great length. Strict return precautions discussed and pt &/or family have verbalized understanding of the instructions. No further questions at time of discharge.    New Prescriptions   No medications on file    Follow Up: [facility] [address] [place] [address] [phone] Schedule an appointment as soon as possible for a visit    [facility] EMERGENCY DEPARTMENT [address] [id number] mc [place] [address] [phone]  If symptoms worsen     Tegeler, Canary Brim, MD 02/04/17 1949

## 2017-02-04 NOTE — ED Notes (Signed)
Patient transported to CT 

## 2017-02-04 NOTE — ED Notes (Signed)
Pt in waiting room restroom family waiting.

## 2017-02-04 NOTE — Discharge Instructions (Signed)
Your workup today showed no evidence of intracranial hemorrhage.  I suspect her headache was worsened by her blood pressure.  As her headache has improved and your blood pressure has improved, we feel you are safe for discharge home.  Please continue your blood pressure medicine and follow-up with your PCP.  If any symptoms change or worsen, please return to the nearest emergency department.

## 2017-03-01 ENCOUNTER — Encounter: Payer: Self-pay | Admitting: Physician Assistant

## 2017-03-01 ENCOUNTER — Other Ambulatory Visit: Payer: Self-pay

## 2017-03-01 ENCOUNTER — Ambulatory Visit: Payer: BLUE CROSS/BLUE SHIELD | Admitting: Physician Assistant

## 2017-03-01 VITALS — BP 110/70 | HR 85 | Temp 98.2°F | Resp 16 | Ht 70.0 in | Wt 241.4 lb

## 2017-03-01 DIAGNOSIS — I10 Essential (primary) hypertension: Secondary | ICD-10-CM

## 2017-03-01 LAB — POCT URINALYSIS DIP (MANUAL ENTRY)
Bilirubin, UA: NEGATIVE
Blood, UA: NEGATIVE
Glucose, UA: NEGATIVE mg/dL
Ketones, POC UA: NEGATIVE mg/dL
Leukocytes, UA: NEGATIVE
Nitrite, UA: NEGATIVE
Protein Ur, POC: NEGATIVE mg/dL
Spec Grav, UA: >=1.03 — AB (ref 1.010–1.025)
Urobilinogen, UA: 0.2 E.U./dL
pH, UA: 5.5 (ref 5.0–8.0)

## 2017-03-01 LAB — POC MICROSCOPIC URINALYSIS (UMFC): Mucus: ABSENT

## 2017-03-01 NOTE — Progress Notes (Signed)
   [place]  MRN: [id number] DOB: 04-07-1965  PCP: Patient, No Pcp Per  Subjective:  Pt is a 51 year old male who presents to clinic for follow-up blood pressure.   He was here 11/02 for elevated blood pressure reading at work. Started Lisinopril 87m qd. Today's blood pressure is 110/70. Denies medication side effects. Denies lightheadedness, dizziness, headache, syncope. He has improved his diet, reduced the amount of salt he eats. Does not exercise.   Review of Systems  Constitutional: Negative for chills and diaphoresis.  Respiratory: Negative for cough, chest tightness, shortness of breath and wheezing.   Cardiovascular: Negative for chest pain, palpitations and leg swelling.  Gastrointestinal: Negative for diarrhea, nausea and vomiting.  Musculoskeletal: Negative for neck pain.  Neurological: Negative for dizziness, syncope, light-headedness and headaches.  Psychiatric/Behavioral: Negative for sleep disturbance. The patient is not nervous/anxious.     Patient Active Problem List   Diagnosis Date Noted  . Cholecystitis, acute 06/12/2012    Current Outpatient Medications on File Prior to Visit  Medication Sig Dispense Refill  . lisinopril (PRINIVIL,ZESTRIL) 10 MG tablet Take 1 tablet (10 mg total) by mouth daily. 90 tablet 3  . ibuprofen (ADVIL) 200 MG tablet Take 400 mg by mouth every 6 (six) hours as needed for headache.    . Phenyleph-Doxylamine-DM-APAP (ALKA-SELTZER PLS NIGHT CLD/FLU PO) Take 1 tablet by mouth daily as needed.     No current facility-administered medications on file prior to visit.     No Known Allergies   Objective:  BP 110/70   Pulse 85   Temp 98.2 F (36.8 C) (Oral)   Resp 16   Ht _0  (1.778 m)   Wt 241 lb 6.4 oz (109.5 kg)   SpO2 98%   BMI 34.64 kg/m   Physical Exam  Constitutional: He is oriented to person, place, and time and well-developed, well-nourished, and in no distress. No distress.  Cardiovascular: Normal rate,  regular rhythm and normal heart sounds.  Neurological: He is alert and oriented to person, place, and time. GCS score is 15.  Skin: Skin is warm and dry.  Psychiatric: Mood, memory, affect and judgment normal.  Vitals reviewed.   Assessment and Plan :  1. Essential hypertension - CMP14+EGFR - POCT urinalysis dipstick - POCT Microscopic Urinalysis ([facility]) - Lipid panel; Future - Pt is not fasting today. Order placed for future lipid order - he will come back for lab only visit to check cholesterol. He is very interested in coming off lisinopril as he does not like being on medications. Will wait for lipid results. Encouraged con't lifestyle improvements. In three months, OK to reduce Lisinopril dose to 5 mg. RTC in 6 months for blood pressure recheck.   WMercer Pod PA-C  [facility]11/28/2018 5:12 PM

## 2017-03-01 NOTE — Patient Instructions (Addendum)
I'd like your blood pressure to be <140/90.  Today yours is 110/70.  Please come back in the next week for a LAB ONLY visit to check your cholesterol. Please be fasting.  Call and leave me a message in 2-3 months when you are almost out of Lisinopril '10mg'$ . I'll call in a prescription  for '5mg'$ .  Come back and see me in 6 months.   Thank you for coming in today. I hope you feel we met your needs.  Feel free to call PCP if you have any questions or further requests.  Please consider signing up for MyChart if you do not already have it, as this is a great way to communicate with me.  Best,  Whitney McVey, PA-C  DASH Eating Plan DASH stands for "Dietary Approaches to Stop Hypertension." The DASH eating plan is a healthy eating plan that has been shown to reduce high blood pressure (hypertension). It may also reduce your risk for type 2 diabetes, heart disease, and stroke. The DASH eating plan may also help with weight loss. What are tips for following this plan? General guidelines  Avoid eating more than 2,300 mg (milligrams) of salt (sodium) a day. If you have hypertension, you may need to reduce your sodium intake to 1,500 mg a day.  Limit alcohol intake to no more than 1 drink a day for nonpregnant women and 2 drinks a day for men. One drink equals 12 oz of beer, 5 oz of wine, or 1 oz of hard liquor.  Work with your health care provider to maintain a healthy body weight or to lose weight. Ask what an ideal weight is for you.  Get at least 30 minutes of exercise that causes your heart to beat faster (aerobic exercise) most days of the week. Activities may include walking, swimming, or biking.  Work with your health care provider or diet and nutrition specialist (dietitian) to adjust your eating plan to your individual calorie needs. Reading food labels  Check food labels for the amount of sodium per serving. Choose foods with less than 5 percent of the Daily Value of sodium. Generally,  foods with less than 300 mg of sodium per serving fit into this eating plan.  To find whole grains, look for the word "whole" as the first word in the ingredient list. Shopping  Buy products labeled as "low-sodium" or "no salt added."  Buy fresh foods. Avoid canned foods and premade or frozen meals. Cooking  Avoid adding salt when cooking. Use salt-free seasonings or herbs instead of table salt or sea salt. Check with your health care provider or pharmacist before using salt substitutes.  Do not fry foods. Cook foods using healthy methods such as baking, boiling, grilling, and broiling instead.  Cook with heart-healthy oils, such as olive, canola, soybean, or sunflower oil. Meal planning   Eat a balanced diet that includes: ? 5 or more servings of fruits and vegetables each day. At each meal, try to fill half of your plate with fruits and vegetables. ? Up to 6-8 servings of whole grains each day. ? Less than 6 oz of lean meat, poultry, or fish each day. A 3-oz serving of meat is about the same size as a deck of cards. One egg equals 1 oz. ? 2 servings of low-fat dairy each day. ? A serving of nuts, seeds, or beans 5 times each week. ? Heart-healthy fats. Healthy fats called Omega-3 fatty acids are found in foods such as flaxseeds and  coldwater fish, like sardines, salmon, and mackerel.  Limit how much you eat of the following: ? Canned or prepackaged foods. ? Food that is high in trans fat, such as fried foods. ? Food that is high in saturated fat, such as fatty meat. ? Sweets, desserts, sugary drinks, and other foods with added sugar. ? Full-fat dairy products.  Do not salt foods before eating.  Try to eat at least 2 vegetarian meals each week.  Eat more home-cooked food and less restaurant, buffet, and fast food.  When eating at a restaurant, ask that your food be prepared with less salt or no salt, if possible. What foods are recommended? The items listed may not be a  complete list. Talk with your dietitian about what dietary choices are best for you. Grains Whole-grain or whole-wheat bread. Whole-grain or whole-wheat pasta. Brown rice. [name]. Bulgur. Whole-grain and low-sodium cereals. Pita bread. Low-fat, low-sodium crackers. Whole-wheat flour tortillas. Vegetables Fresh or frozen vegetables (raw, steamed, roasted, or grilled). Low-sodium or reduced-sodium tomato and vegetable juice. Low-sodium or reduced-sodium tomato sauce and tomato paste. Low-sodium or reduced-sodium canned vegetables. Fruits All fresh, dried, or frozen fruit. Canned fruit in natural juice (without added sugar). Meat and other protein foods Skinless chicken or [place]. Ground chicken or [place]. Pork with fat trimmed off. Fish and seafood. Egg whites. Dried beans, peas, or lentils. Unsalted nuts, nut butters, and seeds. Unsalted canned beans. Lean cuts of beef with fat trimmed off. Low-sodium, lean deli meat. Dairy Low-fat (1%) or fat-free (skim) milk. Fat-free, low-fat, or reduced-fat cheeses. Nonfat, low-sodium ricotta or cottage cheese. Low-fat or nonfat yogurt. Low-fat, low-sodium cheese. Fats and oils Soft margarine without trans fats. Vegetable oil. Low-fat, reduced-fat, or light mayonnaise and salad dressings (reduced-sodium). Canola, safflower, olive, soybean, and sunflower oils. Avocado. Seasoning and other foods Herbs. Spices. Seasoning mixes without salt. Unsalted popcorn and pretzels. Fat-free sweets. What foods are not recommended? The items listed may not be a complete list. Talk with your dietitian about what dietary choices are best for you. Grains Baked goods made with fat, such as croissants, muffins, or some breads. Dry pasta or rice meal packs. Vegetables Creamed or fried vegetables. Vegetables in a cheese sauce. Regular canned vegetables (not low-sodium or reduced-sodium). Regular canned tomato sauce and paste (not low-sodium or reduced-sodium). Regular  tomato and vegetable juice (not low-sodium or reduced-sodium). [name]. Olives. Fruits Canned fruit in a light or heavy syrup. Fried fruit. Fruit in cream or butter sauce. Meat and other protein foods Fatty cuts of meat. Ribs. Fried meat. [name]. Sausage. Bologna and other processed lunch meats. Salami. Fatback. Hotdogs. Bratwurst. Salted nuts and seeds. Canned beans with added salt. Canned or smoked fish. Whole eggs or egg yolks. Chicken or [place] with skin. Dairy Whole or 2% milk, cream, and half-and-half. Whole or full-fat cream cheese. Whole-fat or sweetened yogurt. Full-fat cheese. Nondairy creamers. Whipped toppings. Processed cheese and cheese spreads. Fats and oils Butter. Stick margarine. Lard. Shortening. Ghee. Bacon fat. Tropical oils, such as coconut, palm kernel, or palm oil. Seasoning and other foods Salted popcorn and pretzels. Onion salt, garlic salt, seasoned salt, table salt, and sea salt. Worcestershire sauce. Tartar sauce. Barbecue sauce. Teriyaki sauce. Soy sauce, including reduced-sodium. Steak sauce. Canned and packaged gravies. Fish sauce. Oyster sauce. Cocktail sauce. Horseradish that you find on the shelf. Ketchup. Mustard. Meat flavorings and tenderizers. Bouillon cubes. Hot sauce and Tabasco sauce. Premade or packaged marinades. Premade or packaged taco seasonings. Relishes. Regular salad dressings. Where to find  more information:  National Heart, Lung, and Blood Institute: https://wilson-eaton.com/  American Heart Association: www.heart.org Summary  The DASH eating plan is a healthy eating plan that has been shown to reduce high blood pressure (hypertension). It may also reduce your risk for type 2 diabetes, heart disease, and stroke.  With the DASH eating plan, you should limit salt (sodium) intake to 2,300 mg a day. If you have hypertension, you may need to reduce your sodium intake to 1,500 mg a day.  When on the DASH eating plan, aim to eat more fresh fruits and  vegetables, whole grains, lean proteins, low-fat dairy, and heart-healthy fats.  Work with your health care provider or diet and nutrition specialist (dietitian) to adjust your eating plan to your individual calorie needs. This information is not intended to replace advice given to you by your health care provider. Make sure you discuss any questions you have with your health care provider. Document Released: 03/10/2011 Document Revised: 03/14/2016 Document Reviewed: 03/14/2016 Elsevier Interactive Patient Education  2017 [employer].  IF you received an x-ray today, you will receive an invoice from [facility]. Please contact [facility] at [phone] with questions or concerns regarding your invoice.   IF you received labwork today, you will receive an invoice from [place]. Please contact [facility] at [phone] with questions or concerns regarding your invoice.   Our billing staff will not be able to assist you with questions regarding bills from these companies.  You will be contacted with the lab results as soon as they are available. The fastest way to get your results is to activate your My Chart account. Instructions are located on the last page of this paperwork. If you have not heard from [place] regarding the results in 2 weeks, please contact this office.

## 2017-03-02 LAB — CMP14+EGFR
ALT: 36 IU/L (ref 0–44)
AST: 23 IU/L (ref 0–40)
Albumin/Globulin Ratio: 1.9 (ref 1.2–2.2)
Albumin: 4.5 g/dL (ref 3.5–5.5)
Alkaline Phosphatase: 60 IU/L (ref 39–117)
BUN/Creatinine Ratio: 17 (ref 9–20)
BUN: 14 mg/dL (ref 6–24)
Bilirubin Total: <0.2 mg/dL (ref 0.0–1.2)
CO2: 28 mmol/L (ref 20–29)
Calcium: 9.5 mg/dL (ref 8.7–10.2)
Chloride: 100 mmol/L (ref 96–106)
Creatinine, Ser: 0.83 mg/dL (ref 0.76–1.27)
GFR calc Af Amer: 117 mL/min/{1.73_m2} (ref >59)
GFR calc non Af Amer: 101 mL/min/{1.73_m2} (ref >59)
Globulin, Total: 2.4 g/dL (ref 1.5–4.5)
Glucose: 116 mg/dL — ABNORMAL HIGH (ref 65–99)
Potassium: 4 mmol/L (ref 3.5–5.2)
Sodium: 141 mmol/L (ref 134–144)
Total Protein: 6.9 g/dL (ref 6.0–8.5)

## 2017-03-16 ENCOUNTER — Ambulatory Visit: Payer: BLUE CROSS/BLUE SHIELD | Admitting: Physician Assistant

## 2017-03-16 DIAGNOSIS — I10 Essential (primary) hypertension: Secondary | ICD-10-CM

## 2017-03-17 LAB — LIPID PANEL
Chol/HDL Ratio: 5.7 ratio — ABNORMAL HIGH (ref 0.0–5.0)
Cholesterol, Total: 195 mg/dL (ref 100–199)
HDL: 34 mg/dL — ABNORMAL LOW (ref >39)
LDL Calculated: 88 mg/dL (ref 0–99)
Triglycerides: 367 mg/dL — ABNORMAL HIGH (ref 0–149)
VLDL Cholesterol Cal: 73 mg/dL — ABNORMAL HIGH (ref 5–40)

## 2017-03-21 ENCOUNTER — Other Ambulatory Visit: Payer: Self-pay | Admitting: Physician Assistant

## 2017-03-21 ENCOUNTER — Encounter: Payer: Self-pay | Admitting: Physician Assistant

## 2017-03-21 DIAGNOSIS — E782 Mixed hyperlipidemia: Secondary | ICD-10-CM

## 2017-03-21 MED ORDER — ATORVASTATIN CALCIUM 80 MG PO TABS: 80.0000 mg | ORAL_TABLET | Freq: Every day | ORAL | 3 refills | Status: DC

## 2017-03-21 NOTE — Progress Notes (Signed)
Please call pt and let him know his cholesterol levels are high. I sent in a prescription to his pharmacy for medication to help bring this down. Start taking this daily. He should start aerobic exercise; avoidance of concentrated sugars and alcohol. Come back and see me in three months for recheck. I am sending letter in mail with these results and instructions. Thank you!

## 2017-03-22 NOTE — Progress Notes (Signed)
Letter sent.

## 2017-10-17 ENCOUNTER — Ambulatory Visit: Payer: BLUE CROSS/BLUE SHIELD | Admitting: Physician Assistant

## 2017-10-24 ENCOUNTER — Ambulatory Visit: Payer: BLUE CROSS/BLUE SHIELD | Admitting: Physician Assistant

## 2017-10-24 ENCOUNTER — Encounter: Payer: Self-pay | Admitting: Physician Assistant

## 2017-10-24 VITALS — BP 130/82 | HR 62 | Temp 97.7°F | Resp 17 | Ht 70.5 in | Wt 223.0 lb

## 2017-10-24 DIAGNOSIS — E785 Hyperlipidemia, unspecified: Secondary | ICD-10-CM | POA: Diagnosis not present

## 2017-10-24 DIAGNOSIS — I10 Essential (primary) hypertension: Secondary | ICD-10-CM | POA: Diagnosis not present

## 2017-10-24 DIAGNOSIS — Z131 Encounter for screening for diabetes mellitus: Secondary | ICD-10-CM | POA: Diagnosis not present

## 2017-10-24 MED ORDER — LISINOPRIL 10 MG PO TABS: 10.0000 mg | ORAL_TABLET | Freq: Every day | ORAL | 3 refills | Status: DC

## 2017-10-24 NOTE — Patient Instructions (Addendum)
Take your blood pressure medication every day. You should never run out of this medicine -- either have your pharmacy call us for a refill or you can come back and see me.  If you continue to lose weight and improve your lifestyle, we can consider taking you off this medication in the future. Come back and see me in 6 months.   Please consider having a colonoscopy (please see below for more information). We can discuss this at your next appointment.    DASH Eating Plan DASH stands for "Dietary Approaches to Stop Hypertension." The DASH eating plan is a healthy eating plan that has been shown to reduce high blood pressure (hypertension). It may also reduce your risk for type 2 diabetes, heart disease, and stroke. The DASH eating plan may also help with weight loss. What are tips for following this plan? General guidelines  Avoid eating more than 2,300 mg (milligrams) of salt (sodium) a day. If you have hypertension, you may need to reduce your sodium intake to 1,500 mg a day.  Limit alcohol intake to no more than 1 drink a day for nonpregnant women and 2 drinks a day for men. One drink equals 12 oz of beer, 5 oz of wine, or 1 oz of hard liquor.  Work with your health care provider to maintain a healthy body weight or to lose weight. Ask what an ideal weight is for you.  Get at least 30 minutes of exercise that causes your heart to beat faster (aerobic exercise) most days of the week. Activities may include walking, swimming, or biking.  Work with your health care provider or diet and nutrition specialist (dietitian) to adjust your eating plan to your individual calorie needs. Reading food labels  Check food labels for the amount of sodium per serving. Choose foods with less than 5 percent of the Daily Value of sodium. Generally, foods with less than 300 mg of sodium per serving fit into this eating plan.  To find whole grains, look for the word "whole" as the first word in the ingredient  list. Shopping  Buy products labeled as "low-sodium" or "no salt added."  Buy fresh foods. Avoid canned foods and premade or frozen meals. Cooking  Avoid adding salt when cooking. Use salt-free seasonings or herbs instead of table salt or sea salt. Check with your health care provider or pharmacist before using salt substitutes.  Do not fry foods. Cook foods using healthy methods such as baking, boiling, grilling, and broiling instead.  Cook with heart-healthy oils, such as olive, canola, soybean, or sunflower oil. Meal planning   Eat a balanced diet that includes: ? 5 or more servings of fruits and vegetables each day. At each meal, try to fill half of your plate with fruits and vegetables. ? Up to 6-8 servings of whole grains each day. ? Less than 6 oz of lean meat, poultry, or fish each day. A 3-oz serving of meat is about the same size as a deck of cards. One egg equals 1 oz. ? 2 servings of low-fat dairy each day. ? A serving of nuts, seeds, or beans 5 times each week. ? Heart-healthy fats. Healthy fats called Omega-3 fatty acids are found in foods such as flaxseeds and coldwater fish, like sardines, salmon, and mackerel.  Limit how much you eat of the following: ? Canned or prepackaged foods. ? Food that is high in trans fat, such as fried foods. ? Food that is high in saturated fat, such  as fatty meat. ? Sweets, desserts, sugary drinks, and other foods with added sugar. ? Full-fat dairy products.  Do not salt foods before eating.  Try to eat at least 2 vegetarian meals each week.  Eat more home-cooked food and less restaurant, buffet, and fast food.  When eating at a restaurant, ask that your food be prepared with less salt or no salt, if possible. What foods are recommended? The items listed may not be a complete list. Talk with your dietitian about what dietary choices are best for you. Grains Whole-grain or whole-wheat bread. Whole-grain or whole-wheat pasta. Brown  rice. Derek Pierce. Bulgur. Whole-grain and low-sodium cereals. Pita bread. Low-fat, low-sodium crackers. Whole-wheat flour tortillas. Vegetables Fresh or frozen vegetables (raw, steamed, roasted, or grilled). Low-sodium or reduced-sodium tomato and vegetable juice. Low-sodium or reduced-sodium tomato sauce and tomato paste. Low-sodium or reduced-sodium canned vegetables. Fruits All fresh, dried, or frozen fruit. Canned fruit in natural juice (without added sugar). Meat and other protein foods Skinless chicken or [place]. Ground chicken or [place]. Pork with fat trimmed off. Fish and seafood. Egg whites. Dried beans, peas, or lentils. Unsalted nuts, nut butters, and seeds. Unsalted canned beans. Lean cuts of beef with fat trimmed off. Low-sodium, lean deli meat. Dairy Low-fat (1%) or fat-free (skim) milk. Fat-free, low-fat, or reduced-fat cheeses. Nonfat, low-sodium ricotta or cottage cheese. Low-fat or nonfat yogurt. Low-fat, low-sodium cheese. Fats and oils Soft margarine without trans fats. Vegetable oil. Low-fat, reduced-fat, or light mayonnaise and salad dressings (reduced-sodium). Canola, safflower, olive, soybean, and sunflower oils. Avocado. Seasoning and other foods Herbs. Spices. Seasoning mixes without salt. Unsalted popcorn and pretzels. Fat-free sweets. What foods are not recommended? The items listed may not be a complete list. Talk with your dietitian about what dietary choices are best for you. Grains Baked goods made with fat, such as croissants, muffins, or some breads. Dry pasta or rice meal packs. Vegetables Creamed or fried vegetables. Vegetables in a cheese sauce. Regular canned vegetables (not low-sodium or reduced-sodium). Regular canned tomato sauce and paste (not low-sodium or reduced-sodium). Regular tomato and vegetable juice (not low-sodium or reduced-sodium). Derek Pierce. Olives. Fruits Canned fruit in a light or heavy syrup. Fried fruit. Fruit in cream or butter  sauce. Meat and other protein foods Fatty cuts of meat. Ribs. Fried meat. Derek Pierce. Sausage. Bologna and other processed lunch meats. Salami. Fatback. Hotdogs. Bratwurst. Salted nuts and seeds. Canned beans with added salt. Canned or smoked fish. Whole eggs or egg yolks. Chicken or [place] with skin. Dairy Whole or 2% milk, cream, and half-and-half. Whole or full-fat cream cheese. Whole-fat or sweetened yogurt. Full-fat cheese. Nondairy creamers. Whipped toppings. Processed cheese and cheese spreads. Fats and oils Butter. Stick margarine. Lard. Shortening. Ghee. Bacon fat. Tropical oils, such as coconut, palm kernel, or palm oil. Seasoning and other foods Salted popcorn and pretzels. Onion salt, garlic salt, seasoned salt, table salt, and sea salt. Worcestershire sauce. Tartar sauce. Barbecue sauce. Teriyaki sauce. Soy sauce, including reduced-sodium. Steak sauce. Canned and packaged gravies. Fish sauce. Oyster sauce. Cocktail sauce. Horseradish that you find on the shelf. Ketchup. Mustard. Meat flavorings and tenderizers. Bouillon cubes. Hot sauce and Tabasco sauce. Premade or packaged marinades. Premade or packaged taco seasonings. Relishes. Regular salad dressings. Where to find more information:  National Heart, Lung, and [place]: https://wilson-eaton.com/  American Heart Association: www.heart.org Summary  The DASH eating plan is a healthy eating plan that has been shown to reduce high blood pressure (hypertension). It may also reduce your risk  for type 2 diabetes, heart disease, and stroke.  With the DASH eating plan, you should limit salt (sodium) intake to 2,300 mg a day. If you have hypertension, you may need to reduce your sodium intake to 1,500 mg a day.  When on the DASH eating plan, aim to eat more fresh fruits and vegetables, whole grains, lean proteins, low-fat dairy, and heart-healthy fats.  Work with your health care provider or diet and nutrition specialist (dietitian) to adjust  your eating plan to your individual calorie needs. This information is not intended to replace advice given to you by your health care provider. Make sure you discuss any questions you have with your health care provider. Document Released: 03/10/2011 Document Revised: 03/14/2016 Document Reviewed: 03/14/2016 Elsevier Interactive Patient Education  2018 [employer] --------------------------------------------------------------------------------------------------------------------------------------------- Colorectal Cancer Screening Colorectal cancer screening is a group of tests used to check for colorectal cancer. Colorectal refers to your colon and rectum. Your colon and rectum are located at the end of your large intestine and carry your bowel movements out of your body. Why is colorectal cancer screening done? It is common for abnormal growths (polyps) to form in the lining of your colon, especially as you get older. These polyps can be cancerous or become cancerous. If colorectal cancer is found at an early stage, it is treatable. Who should be screened for colorectal cancer? Screening is recommended for all adults at average risk starting at age 53. Tests may be recommended every 1 to 10 years. Your health care provider may recommend earlier or more frequent screening if you have:  A history of colorectal cancer or polyps.  A family member with a history of colorectal cancer or polyps.  Inflammatory bowel disease, such as ulcerative colitis or Crohn disease.  A type of hereditary colon cancer syndrome.  Colorectal cancer symptoms.  Types of screening tests There are several types of colorectal screening tests. They include:  Guaiac-based fecal occult blood testing.  Fecal immunochemical test (FIT).  Stool DNA test.  Barium enema.  Virtual colonoscopy.  Sigmoidoscopy. During this test, a sigmoidoscope is used to examine your rectum and lower colon. A sigmoidoscope is a  flexible tube with a camera that is inserted through your anus into your rectum and lower colon.  Colonoscopy. During this test, a colonoscope is used to examine your entire colon. A colonoscope is a long, thin, flexible tube with a camera. This test examines your entire colon and rectum.  This information is not intended to replace advice given to you by your health care provider. Make sure you discuss any questions you have with your health care provider. Document Released: 09/08/2009 Document Revised: 10/29/2015 Document Reviewed: 06/27/2013 Elsevier Interactive Patient Education  2018 [employer]   IF you received an x-ray today, you will receive an invoice from [facility]. Please contact [facility] at [phone] with questions or concerns regarding your invoice.   IF you received labwork today, you will receive an invoice from [facility]. Please contact [facility] at [phone] with questions or concerns regarding your invoice.   Our billing staff will not be able to assist you with questions regarding bills from these companies.  You will be contacted with the lab results as soon as they are available. The fastest way to get your results is to activate your My Chart account. Instructions are located on the last page of this paperwork. If you have not heard from us regarding the results in 2 weeks, please contact this office.

## 2017-10-24 NOTE — Progress Notes (Signed)
Derek Pierce  MRN: [id number] DOB: 1965/04/22  PCP: Patient, No Pcp Per  Subjective:  Pt is a 52 year old male who presents to clinic for f/u HTN and HLD. He is here today with his son. He brings with him today his home blood pressure monitor wondering if it is working correctly. "the numbers are all over the pace" He has not taken medication since April. Blood pressure today is 160/88.  He did not receive letter in the mail regarding elevated lipid level and instructions to start atorvastatin.   He has made changes to his diet.  He is running in the park a few times a week.  20lb weight loss since his last office visit.   He does not smoke or drink alcohol.  Review of Systems  Constitutional: Negative for chills and diaphoresis.  Respiratory: Negative for cough, chest tightness, shortness of breath and wheezing.   Cardiovascular: Negative for chest pain, palpitations and leg swelling.  Gastrointestinal: Negative for diarrhea, nausea and vomiting.  Musculoskeletal: Negative for neck pain.  Neurological: Negative for dizziness, syncope, light-headedness and headaches.  Psychiatric/Behavioral: Negative for sleep disturbance. The patient is not nervous/anxious.     Patient Active Problem List   Diagnosis Date Noted  . Cholecystitis, acute 06/12/2012    Current Outpatient Medications on File Prior to Visit  Medication Sig Dispense Refill  . atorvastatin (LIPITOR) 80 MG tablet Take 1 tablet (80 mg total) by mouth daily. (Patient not taking: Reported on 10/24/2017) 90 tablet 3  . ibuprofen (ADVIL) 200 MG tablet Take 400 mg by mouth every 6 (six) hours as needed for headache.    . lisinopril (PRINIVIL,ZESTRIL) 10 MG tablet Take 1 tablet (10 mg total) by mouth daily. (Patient not taking: Reported on 10/24/2017) 90 tablet 3  . Phenyleph-Doxylamine-DM-APAP (ALKA-SELTZER PLS NIGHT CLD/FLU PO) Take 1 tablet by mouth daily as needed.     No current facility-administered medications on  file prior to visit.    No Known Allergies   Objective:  BP (!) 160/88   Pulse 62   Temp 97.7 F (36.5 C) (Oral)   Resp 17   Ht 5' 10.5" (1.791 m)   Wt 223 lb (101.2 kg)   SpO2 98%   BMI 31.54 kg/m   Physical Exam  Constitutional: He is oriented to person, place, and time. He appears well-developed and well-nourished.  Cardiovascular: Normal rate and regular rhythm.  Neurological: He is alert and oriented to person, place, and time.  Skin: Skin is warm and dry.  Psychiatric: He has a normal mood and affect. His behavior is normal. Judgment and thought content normal.  Vitals reviewed.   Assessment and Plan :  1. Essential hypertension - Pt presents for f/u HTN. He was asked to RTC after letter sent in mail asked him to start atorvastatin. He never received letter and has been out of lisinopril x 3 months. Discussed with pt need to not run out of medicine. He is interested in stopping medications. Discussed with pt perhaps if he con't to lose weight and work on lifestyle changes, this can be a possibility. He has lost 20lbs since last OV 7 months ago. RTC in 6 months for recheck.  - POCT urinalysis dipstick - CMP14+EGFR - Recheck vitals - lisinopril (PRINIVIL,ZESTRIL) 10 MG tablet; Take 1 tablet (10 mg total) by mouth daily.  Dispense: 90 tablet; Refill: 3  2. Hyperlipidemia, unspecified hyperlipidemia type - Lipid panel  3. Screening for diabetes mellitus - Hemoglobin A1c  Mercer Pod, PA-C  [facility] 10/24/2017 4:06 PM

## 2017-10-25 ENCOUNTER — Encounter: Payer: Self-pay | Admitting: Physician Assistant

## 2017-10-25 DIAGNOSIS — E782 Mixed hyperlipidemia: Secondary | ICD-10-CM

## 2017-10-25 LAB — CMP14+EGFR
ALT: 20 IU/L (ref 0–44)
AST: 24 IU/L (ref 0–40)
Albumin/Globulin Ratio: 1.7 (ref 1.2–2.2)
Albumin: 4.3 g/dL (ref 3.5–5.5)
Alkaline Phosphatase: 57 IU/L (ref 39–117)
BUN/Creatinine Ratio: 13 (ref 9–20)
BUN: 11 mg/dL (ref 6–24)
Bilirubin Total: 0.4 mg/dL (ref 0.0–1.2)
CO2: 22 mmol/L (ref 20–29)
Calcium: 9.1 mg/dL (ref 8.7–10.2)
Chloride: 101 mmol/L (ref 96–106)
Creatinine, Ser: 0.84 mg/dL (ref 0.76–1.27)
GFR calc Af Amer: 116 mL/min/{1.73_m2} (ref >59)
GFR calc non Af Amer: 100 mL/min/{1.73_m2} (ref >59)
Globulin, Total: 2.5 g/dL (ref 1.5–4.5)
Glucose: 88 mg/dL (ref 65–99)
Potassium: 4.4 mmol/L (ref 3.5–5.2)
Sodium: 140 mmol/L (ref 134–144)
Total Protein: 6.8 g/dL (ref 6.0–8.5)

## 2017-10-25 LAB — LIPID PANEL
Chol/HDL Ratio: 5.8 ratio — ABNORMAL HIGH (ref 0.0–5.0)
Cholesterol, Total: 173 mg/dL (ref 100–199)
HDL: 30 mg/dL — ABNORMAL LOW (ref >39)
LDL Calculated: 72 mg/dL (ref 0–99)
Triglycerides: 357 mg/dL — ABNORMAL HIGH (ref 0–149)
VLDL Cholesterol Cal: 71 mg/dL — ABNORMAL HIGH (ref 5–40)

## 2017-10-25 LAB — HEMOGLOBIN A1C
Est. average glucose Bld gHb Est-mCnc: 111 mg/dL
Hgb A1c MFr Bld: 5.5 % (ref 4.8–5.6)

## 2017-10-25 MED ORDER — ATORVASTATIN CALCIUM 80 MG PO TABS: 80.0000 mg | ORAL_TABLET | Freq: Every day | ORAL | 3 refills | Status: DC

## 2017-10-25 NOTE — Progress Notes (Signed)
Please call pt and let him know: Your cholesterol levels are high. This can significantly increase your risk of developing cardiovascular disease, heart attacks, strokes, and other problems. Please start taking Atorvastatin 80mg  once daily. This is at his pharmacy. Continue making changes in your day-to-day habits, including reducing the amount of total and saturated fat in your diet, losing weight (if you are overweight or obese), getting regular aerobic exercise, and eating plenty of fruits and vegetables.  Come back and see me in 3 months for recheck. Please do not eat anything for 8 hours before your visit.

## 2017-11-01 ENCOUNTER — Encounter: Payer: Self-pay | Admitting: *Deleted

## 2018-02-15 ENCOUNTER — Telehealth: Payer: Self-pay | Admitting: Physician Assistant

## 2018-02-15 NOTE — Telephone Encounter (Signed)
Called and spoke with patient regarding their appt on 04/27/18 with McVey. Advised that we have 3 providers that are accepting new patients. I was able to make an appt with Dr. Alvy BimlerSagardia on 04/25/18 at 4:40. I advised pt of time, building number and late policy. Pt acknowledged.

## 2018-04-25 ENCOUNTER — Ambulatory Visit: Payer: BLUE CROSS/BLUE SHIELD | Admitting: Emergency Medicine

## 2018-04-27 ENCOUNTER — Ambulatory Visit: Payer: BLUE CROSS/BLUE SHIELD | Admitting: Physician Assistant

## 2018-04-30 ENCOUNTER — Other Ambulatory Visit: Payer: Self-pay

## 2018-04-30 ENCOUNTER — Encounter: Payer: Self-pay | Admitting: Family Medicine

## 2018-04-30 ENCOUNTER — Ambulatory Visit: Payer: BLUE CROSS/BLUE SHIELD | Admitting: Family Medicine

## 2018-04-30 VITALS — BP 125/72 | HR 72 | Temp 98.3°F | Resp 17 | Ht 70.5 in | Wt 238.8 lb

## 2018-04-30 DIAGNOSIS — I10 Essential (primary) hypertension: Secondary | ICD-10-CM

## 2018-04-30 DIAGNOSIS — E785 Hyperlipidemia, unspecified: Secondary | ICD-10-CM

## 2018-04-30 DIAGNOSIS — Z1211 Encounter for screening for malignant neoplasm of colon: Secondary | ICD-10-CM

## 2018-04-30 MED ORDER — LISINOPRIL 10 MG PO TABS: 10.0000 mg | ORAL_TABLET | Freq: Every day | ORAL | 3 refills | Status: DC

## 2018-04-30 NOTE — Patient Instructions (Addendum)
If you have lab work done today you will be contacted with your lab results within the next 2 weeks.  If you have not heard from [place] then please contact us. The fastest way to get your results is to register for My Chart.   IF you received an x-ray today, you will receive an invoice from [facility]. Please contact [facility] at [phone] with questions or concerns regarding your invoice.   IF you received labwork today, you will receive an invoice from [facility]. Please contact [facility] at [phone] with questions or concerns regarding your invoice.   Our billing staff will not be able to assist you with questions regarding bills from these companies.  You will be contacted with the lab results as soon as they are available. The fastest way to get your results is to activate your My Chart account. Instructions are located on the last page of this paperwork. If you have not heard from [place] regarding the results in 2 weeks, please contact this office.     Stool DNA Testing for Colon Cancer Why am I having this test? Colon cancer is the second-leading cause of cancer deaths in men and women. Testing for colon cancer before symptoms develop (screening) reduces your risk for this cancer. Stool DNA testing, also called the FIT-DNA test, is one method of screening for colon cancer. Colon cancer grows slowly, so finding the cancer early means a better chance for effective treatment. All adults should have colon cancer screening starting at age 5 and continuing until age 46. Your health care provider may recommend screening at age 75. Screening may include having stool DNA testing every 3 years if:  You have no symptoms of colon cancer. Symptoms include rectal bleeding, weight loss, and changes in bowel habits.  You have an average risk of colon cancer. Average risk means: ? You do not have precancerous polyps. ? You do not have family or personal history of either colon  cancer or a colon disease that increases your risk for colon cancer. What is being tested? For the test, a sample of your stool (feces) is checked for blood and changes in DNA that could lead to cancer. Growths in your colon that are cancerous (malignant) or may become cancerous (precancerous polyps) bleed and shed cells. Blood and cells can be picked up by stool as it passes through your colon. This test checks for blood cells as well as nine types of DNA (biomarkers) in three genes that have been linked to colon cancer and precancerous polyps. What kind of sample is taken?  This test uses a stool sample that is collected when you have a bowel movement. How do I collect samples at home? You will be sent a stool sample collection kit in the mail. It will include instructions and everything you need to get the sample. Instructions may include these steps:  Store the kit in a dry place at room temperature until you are ready to collect the sample. Keep the kit away from heat and sunlight.  To collect the sample, place the collection device over the toilet.  Collect the sample according to the instructions that came with your kit.  After collecting a stool sample, follow instructions carefully regarding mixing the stool with a preservative and sealing the sample.  Send the sample to the lab in the postage-paid box that was included in the kit. Your stool sample will be checked within 3 days. The results will be sent  sent to your health care provider. How do I prepare for this test? There is no preparation required for this test. However, do not collect a stool sample if:  You have bleeding hemorrhoids.  You have any rectal bleeding.  You have a cut on your hand or finger.  You have your menstrual period.  You have diarrhea. How are the results reported? Your test results will be reported as either positive or negative. It is up to you to get your test results. Ask your health care provider,  or the department that is doing the test, when your results will be ready. What do the results mean?  A positive result means that the test found abnormal DNA, blood cells, or both. If you have a positive result, you will need to have a follow-up exam of your colon done with a scope (colonoscopy).  A negative result means that no blood or changes in DNA were found. This does not guarantee that you do not have colon cancer. Your health care provider may recommend that you have other screening tests. Talk with your health care provider to discuss your results, treatment options, and if necessary, the need for more tests. Talk with your health care provider if you have any questions about your results. This information is not intended to replace advice given to you by your health care provider. Make sure you discuss any questions you have with your health care provider. Document Released: 12/10/2015 Document Revised: 05/11/2017 Document Reviewed: 12/10/2015 Elsevier Interactive Patient Education  2019 Elsevier Inc.  

## 2018-05-01 LAB — LIPID PANEL
CHOL/HDL RATIO: 3.3 ratio (ref 0.0–5.0)
Cholesterol, Total: 108 mg/dL (ref 100–199)
HDL: 33 mg/dL — AB (ref >39)
LDL Calculated: 26 mg/dL (ref 0–99)
TRIGLYCERIDES: 247 mg/dL — AB (ref 0–149)
VLDL CHOLESTEROL CAL: 49 mg/dL — AB (ref 5–40)

## 2018-05-01 LAB — CMP14+EGFR
A/G RATIO: 1.7 (ref 1.2–2.2)
ALT: 38 IU/L (ref 0–44)
AST: 27 IU/L (ref 0–40)
Albumin: 4.5 g/dL (ref 3.8–4.9)
Alkaline Phosphatase: 68 IU/L (ref 39–117)
BUN/Creatinine Ratio: 13 (ref 9–20)
BUN: 12 mg/dL (ref 6–24)
Bilirubin Total: 0.5 mg/dL (ref 0.0–1.2)
CO2: 20 mmol/L (ref 20–29)
CREATININE: 0.96 mg/dL (ref 0.76–1.27)
Calcium: 9 mg/dL (ref 8.7–10.2)
Chloride: 103 mmol/L (ref 96–106)
GFR, EST AFRICAN AMERICAN: 103 mL/min/{1.73_m2} (ref >59)
GFR, EST NON AFRICAN AMERICAN: 89 mL/min/{1.73_m2} (ref >59)
Globulin, Total: 2.6 g/dL (ref 1.5–4.5)
Glucose: 79 mg/dL (ref 65–99)
POTASSIUM: 4.3 mmol/L (ref 3.5–5.2)
Sodium: 141 mmol/L (ref 134–144)
TOTAL PROTEIN: 7.1 g/dL (ref 6.0–8.5)

## 2018-05-01 NOTE — Progress Notes (Signed)
Established Patient Office Visit  Subjective:  Patient ID: [place], male    DOB: Jun 05, 1965  Age: 53 y.o. MRN: [id number]  CC:  Chief Complaint  Patient presents with  . Blood Pressure Check    HPI Derek Pierce presents for   Hypertension: Patient here for follow-up of elevated blood pressure. He is not exercising and is not adherent to low salt diet.  Blood pressure is well controlled at home. Cardiac symptoms none. Patient denies chest pain, chest pressure/discomfort, claudication, dyspnea and exertional chest pressure/discomfort.  Cardiovascular risk factors: diabetes mellitus, dyslipidemia, hypertension, male gender and obesity (BMI >= 30 kg/m2). Use of agents associated with hypertension: none. History of target organ damage: none. BP Readings from Last 3 Encounters:  04/30/18 125/72  10/24/17 130/82  03/01/17 110/70   Dyslipidemia: Patient presents for evaluation of lipids.  Compliance with treatment thus far has been good.  A repeat fasting lipid profile was done.  The patient does not use medications that may worsen dyslipidemias (corticosteroids, progestins, anabolic steroids, diuretics, beta-blockers, amiodarone, cyclosporine, olanzapine). The patient exercises rarely.  The patient is not known to have coexisting coronary artery disease.    Colon Cancer Screening He has never had a colonoscopy He denies blood in his stool, unexpected weight loss or pain with defecation No rectal itching He does not smoke He does not have a family history of colon cancer    Past Medical History:  Diagnosis Date  . Cholecystitis, acute 06/12/2012  . Hypertension 02/03/2017    Past Surgical History:  Procedure Laterality Date  . CHOLECYSTECTOMY N/A 05/20/2012   Procedure: LAPAROSCOPIC CHOLECYSTECTOMY WITH INTRAOPERATIVE CHOLANGIOGRAM;  Surgeon: Gwenyth Ober, MD;  Location: [facility];  Service: General;  Laterality: N/A;    No family history on file.  Social History     Socioeconomic History  . Marital status: Married    Spouse name: Not on file  . Number of children: Not on file  . Years of education: Not on file  . Highest education level: Not on file  Occupational History  . Not on file  Social Needs  . Financial resource strain: Not on file  . Food insecurity:    Worry: Not on file    Inability: Not on file  . Transportation needs:    Medical: Not on file    Non-medical: Not on file  Tobacco Use  . Smoking status: Former Smoker    Types: Cigarettes    Last attempt to quit: 04/05/2007    Years since quitting: 11.0  . Smokeless tobacco: Never Used  Substance and Sexual Activity  . Alcohol use: No  . Drug use: No  . Sexual activity: Not on file  Lifestyle  . Physical activity:    Days per week: Not on file    Minutes per session: Not on file  . Stress: Not on file  Relationships  . Social connections:    Talks on phone: Not on file    Gets together: Not on file    Attends religious service: Not on file    Active member of club or organization: Not on file    Attends meetings of clubs or organizations: Not on file    Relationship status: Not on file  . Intimate partner violence:    Fear of current or ex partner: Not on file    Emotionally abused: Not on file    Physically abused: Not on file    Forced sexual activity: Not on  file  Other Topics Concern  . Not on file  Social History Narrative  . Not on file    Outpatient Medications Prior to Visit  Medication Sig Dispense Refill  . atorvastatin (LIPITOR) 80 MG tablet Take 1 tablet (80 mg total) by mouth daily. 90 tablet 3  . ibuprofen (ADVIL) 200 MG tablet Take 400 mg by mouth every 6 (six) hours as needed for headache.    . Phenyleph-Doxylamine-DM-APAP (ALKA-SELTZER PLS NIGHT CLD/FLU PO) Take 1 tablet by mouth daily as needed.    Marland Kitchen lisinopril (PRINIVIL,ZESTRIL) 10 MG tablet Take 1 tablet (10 mg total) by mouth daily. 90 tablet 3   No facility-administered medications prior  to visit.     No Known Allergies  ROS Review of Systems See hpi   Objective:    Physical Exam  BP 125/72 (BP Location: Left Arm, Patient Position: Sitting, Cuff Size: Large)   Pulse 72   Temp 98.3 F (36.8 C) (Oral)   Resp 17   Ht 5' 10.5" (1.791 m)   Wt 238 lb 12.8 oz (108.3 kg)   SpO2 96%   BMI 33.78 kg/m  Wt Readings from Last 3 Encounters:  04/30/18 238 lb 12.8 oz (108.3 kg)  10/24/17 223 lb (101.2 kg)  03/01/17 241 lb 6.4 oz (109.5 kg)   Physical Exam  Constitutional: Oriented to person, place, and time. Appears well-developed and well-nourished.  HENT:  Head: Normocephalic and atraumatic.  Eyes: Conjunctivae and EOM are normal.  Cardiovascular: Normal rate, regular rhythm, normal heart sounds and intact distal pulses.  No murmur heard. Pulmonary/Chest: Effort normal and breath sounds normal. No stridor. No respiratory distress. Has no wheezes.  Neurological: Is alert and oriented to person, place, and time.  Skin: Skin is warm. Capillary refill takes less than 2 seconds.  Psychiatric: Has a normal mood and affect. Behavior is normal. Judgment and thought content normal.    Health Maintenance Due  Topic Date Due  . HIV Screening  04/05/1979  . COLONOSCOPY  04/04/2014    There are no preventive care reminders to display for this patient.  No results found for: TSH Lab Results  Component Value Date   WBC 8.7 01/16/2015   HGB 15.9 01/16/2015   HCT 45.3 01/16/2015   MCV 83.7 01/16/2015   PLT 222 05/18/2012   Lab Results  Component Value Date   NA 141 04/30/2018   K 4.3 04/30/2018   CO2 20 04/30/2018   GLUCOSE 79 04/30/2018   BUN 12 04/30/2018   CREATININE 0.96 04/30/2018   BILITOT 0.5 04/30/2018   ALKPHOS 68 04/30/2018   AST 27 04/30/2018   ALT 38 04/30/2018   PROT 7.1 04/30/2018   ALBUMIN 4.5 04/30/2018   CALCIUM 9.0 04/30/2018   Lab Results  Component Value Date   CHOL 108 04/30/2018   Lab Results  Component Value Date   HDL 33 (L)  04/30/2018   Lab Results  Component Value Date   LDLCALC 26 04/30/2018   Lab Results  Component Value Date   TRIG 247 (H) 04/30/2018   Lab Results  Component Value Date   CHOLHDL 3.3 04/30/2018   Lab Results  Component Value Date   HGBA1C 5.5 10/24/2017      Assessment & Plan:   Problem List Items Addressed This Visit    None    Visit Diagnoses    Essential hypertension    -  Primary-  Patient's blood pressure is at goal of 139/89 or less. Condition is  stable. Continue current medications and treatment plan. I recommend that you exercise for 30-45 minutes 5 days a week. I also recommend a balanced diet with fruits and vegetables every day, lean meats, and little fried foods. The DASH diet (you can find this online) is a good example of this.    Relevant Medications   lisinopril (PRINIVIL,ZESTRIL) 10 MG tablet   Other Relevant Orders   CMP14+EGFR (Completed)   Lipid panel (Completed)   Special screening for malignant neoplasms, colon    - discussed options, pt declined screening with colonoscopy but understands that a positive cologuard will warrant colonoscopy   Relevant Orders   Cologuard   Dyslipidemia    -  Improved with statin compliance      Meds ordered this encounter  Medications  . lisinopril (PRINIVIL,ZESTRIL) 10 MG tablet    Sig: Take 1 tablet (10 mg total) by mouth daily.    Dispense:  90 tablet    Refill:  3    Store refills on file. Provider moved out of the area.    Follow-up: Return in about 6 months (around 10/29/2018) for physical exam and hypertension .    Forrest Moron, MD

## 2018-10-31 ENCOUNTER — Encounter: Payer: BLUE CROSS/BLUE SHIELD | Admitting: Family Medicine

## 2018-11-01 ENCOUNTER — Encounter: Payer: Self-pay | Admitting: Family Medicine

## 2018-11-26 ENCOUNTER — Other Ambulatory Visit: Payer: Self-pay | Admitting: Physician Assistant

## 2018-11-26 DIAGNOSIS — E782 Mixed hyperlipidemia: Secondary | ICD-10-CM

## 2018-11-26 NOTE — Telephone Encounter (Signed)
Requested medication (s) are due for refill today: yes  Requested medication (s) are on the active medication list: yes  Last refill:  10/25/2017  Future visit scheduled: no  Notes to clinic:  Unable to refill per protocol. Tried to contact patient but he was unable to speak with me because he was at work and states that he will call the office back.   Requested Prescriptions  Pending Prescriptions Disp Refills   atorvastatin (LIPITOR) 80 MG tablet [Pharmacy Med Name: Atorvastatin Calcium 80 MG Oral Tablet] 90 tablet 0    Sig: Take 1 tablet by mouth once daily     Cardiovascular:  Antilipid - Statins Failed - 11/26/2018 12:55 PM      Failed - HDL in normal range and within 360 days    HDL  Date Value Ref Range Status  04/30/2018 33 (L) >39 mg/dL Final         Failed - Triglycerides in normal range and within 360 days    Triglycerides  Date Value Ref Range Status  04/30/2018 247 (H) 0 - 149 mg/dL Final         Failed - Valid encounter within last 12 months    Recent Outpatient Visits          7 months ago Essential hypertension   [facility], Arlie Solomons, MD   1 year ago Essential hypertension   [facility], Gelene Mink, PA-C   1 year ago Essential hypertension   [facility] at [facility], [place]o, PA-C   1 year ago Elevated blood pressure reading   [facility] at [facility], Gelene Mink, PA-C   3 years ago Need for meningococcal vaccination   [facility], Dalbert Batman, FNP             Passed - Total Cholesterol in normal range and within 360 days    Cholesterol, Total  Date Value Ref Range Status  04/30/2018 108 100 - 199 mg/dL Final         Passed - LDL in normal range and within 360 days    LDL Calculated  Date Value Ref Range Status  04/30/2018 26 0 - 99 mg/dL Final         Passed - Patient is not pregnant

## 2019-05-10 ENCOUNTER — Telehealth: Payer: Self-pay | Admitting: Family Medicine

## 2019-05-10 NOTE — Telephone Encounter (Signed)
Pt is wanting a refill on his lisinopril (PRINIVIL,ZESTRIL) 10 MG tablet [[id number]]. I informed pt his dad would need a appointment,   He can not bring him in today. His dad-pt is leaving out of country on Monday

## 2019-05-10 NOTE — Telephone Encounter (Signed)
This goes with earlier phone message, accidentally closed out. Please advise at [phone].

## 2019-05-13 ENCOUNTER — Encounter: Payer: Self-pay | Admitting: Emergency Medicine

## 2019-05-13 NOTE — Telephone Encounter (Signed)
Rx was sent on 05/03/19 with refills

## 2019-09-27 IMAGING — CT CT HEAD W/O CM
4 series · 17 of 47 positions shown, 19 images · non-contrast
Comparison: None.

CLINICAL DATA: 51-year-old male with headache.

EXAM:
CT HEAD WITHOUT CONTRAST
TECHNIQUE: Contiguous axial images were obtained from the base of the skull
through the vertex without intravenous contrast.

[Series 3: head without · axial · non-contrast · 0.47mm/px · z∈[-80,+55]mm · 7 of 37 slices shown, 9 images]
[im 5/37  brain]
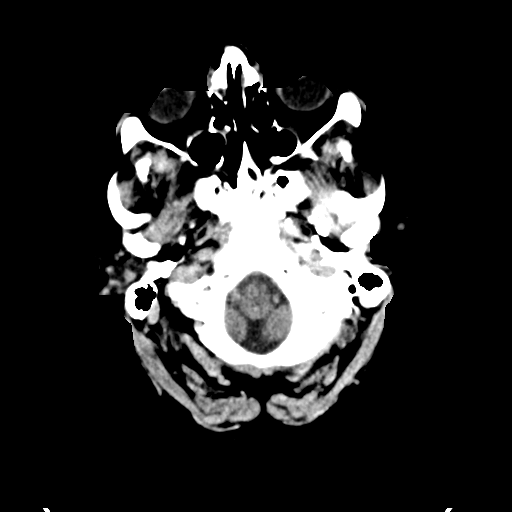
[im 5/37  bone]
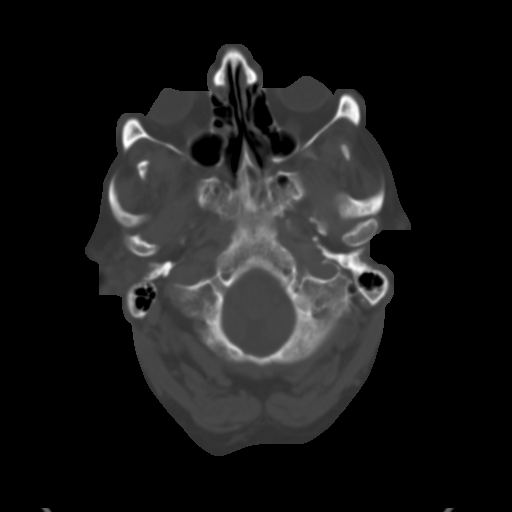
[im 10/37  brain]
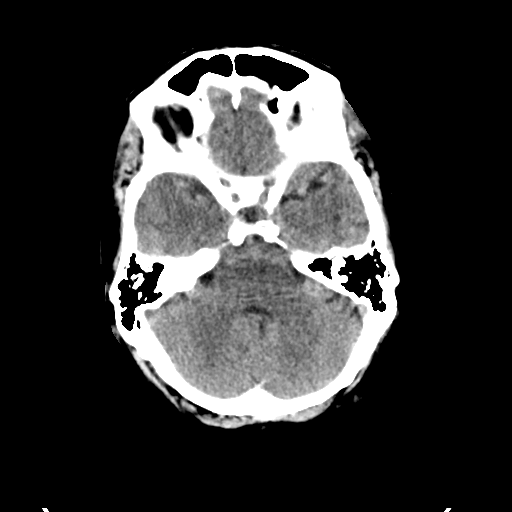
[im 14/37  brain]
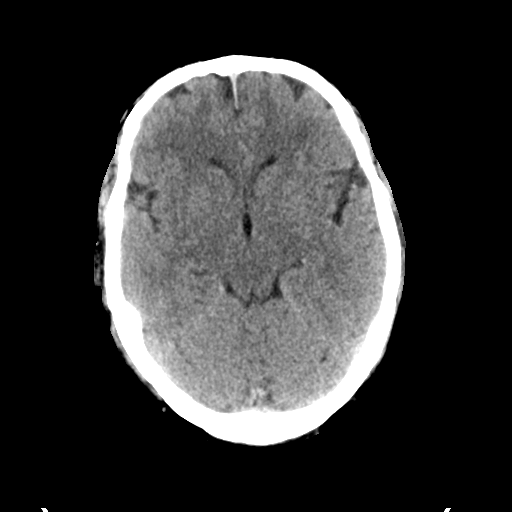
[im 19/37  brain]
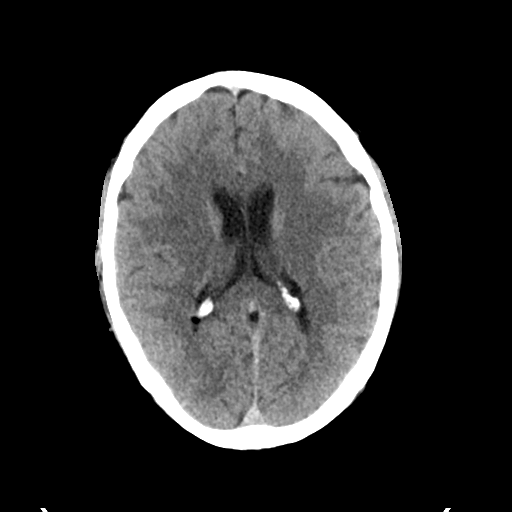
[im 23/37  brain]
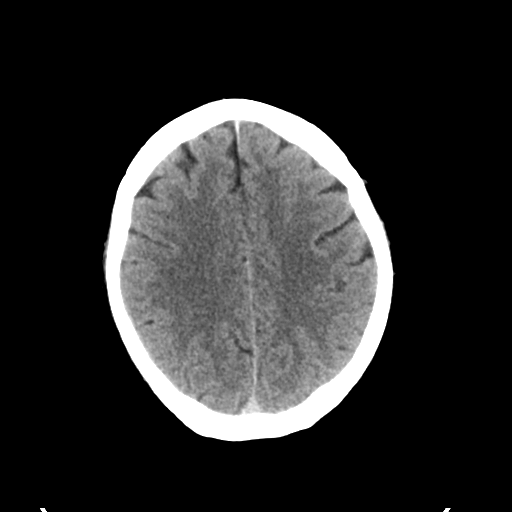
[im 23/37  bone]
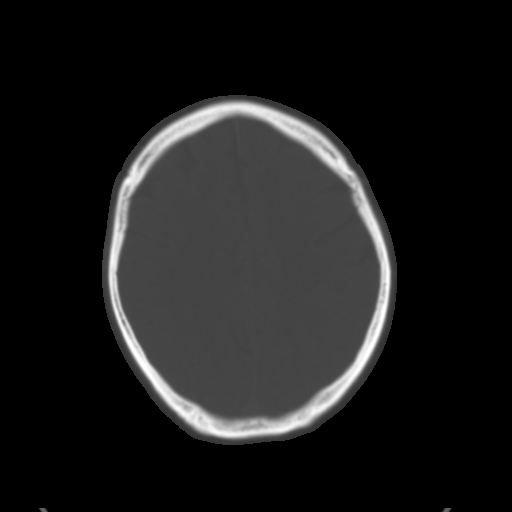
[im 28/37  brain]
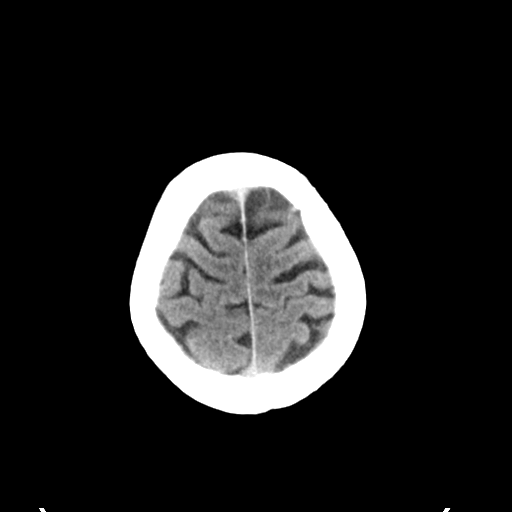
[im 32/37  brain]
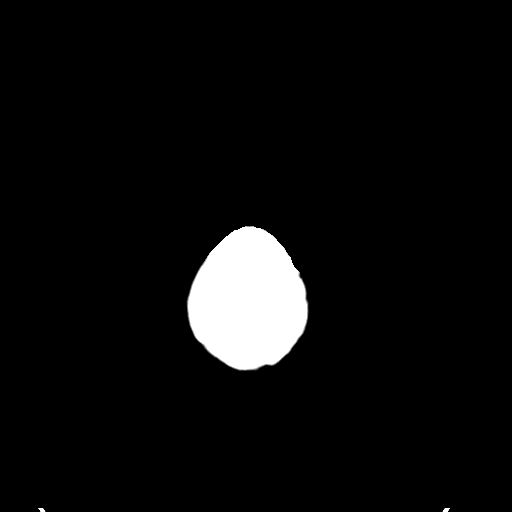

[Series 4: head bone · axial · 0.47mm/px · z∈[-82,-20]mm · 4 of 91 slices shown]
[im 10/91  bone]
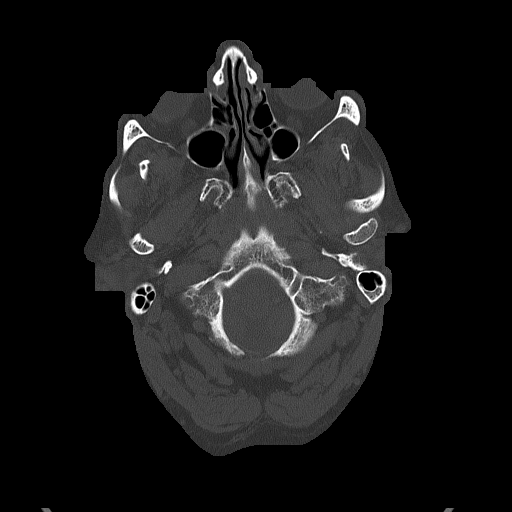
[im 19/91  bone]
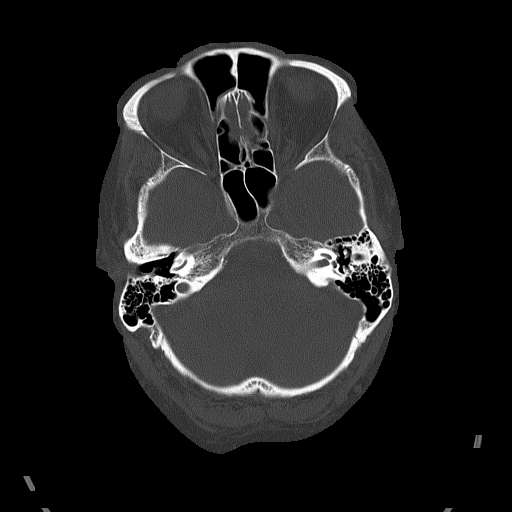
[im 28/91  bone]
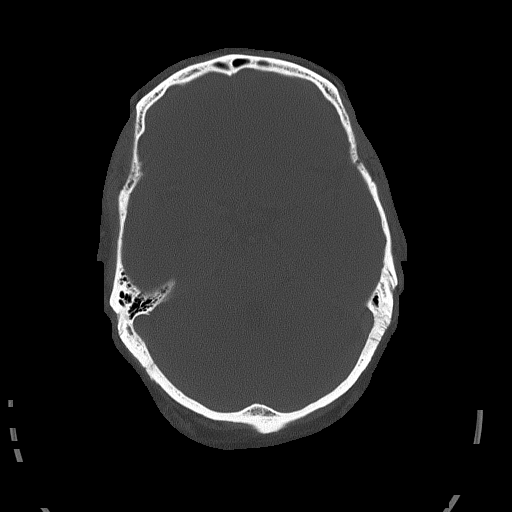
[im 41/91  bone]
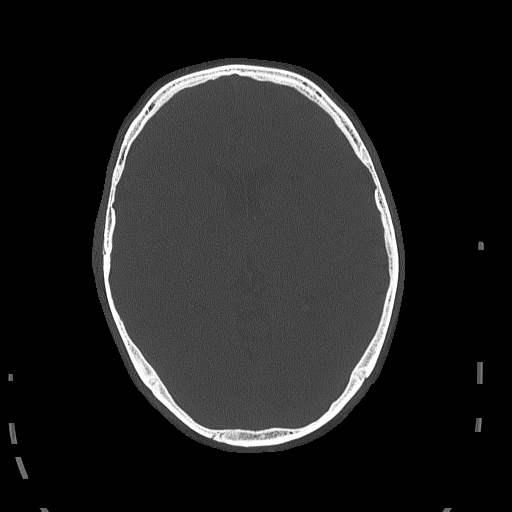

[Series 5: head without cor · coronal · non-contrast · 0.39mm/px · 3 of 67 slices shown]
[im 23/67  brain]
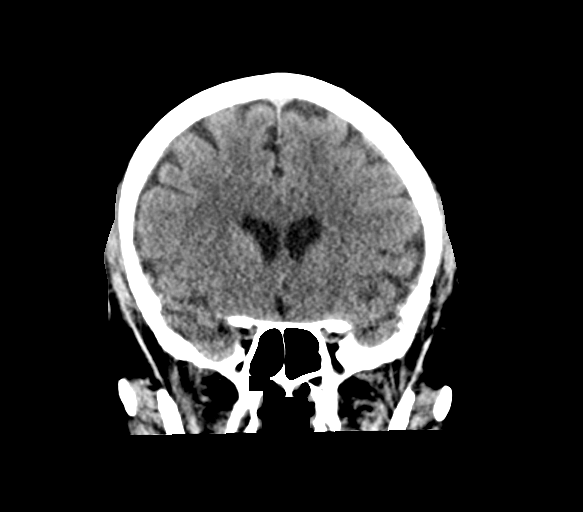
[im 30/67  brain]
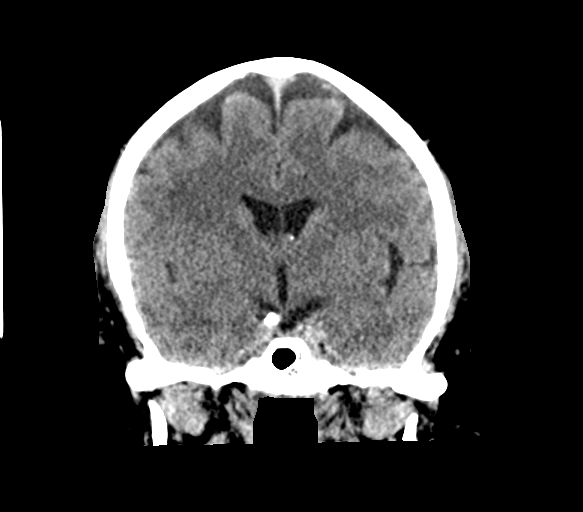
[im 37/67  brain]
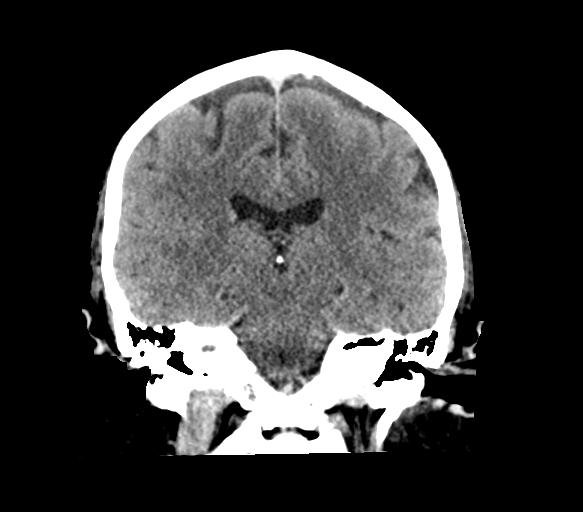

[Series 6: head without sag · sagittal · non-contrast · 0.40mm/px · 3 of 67 slices shown]
[im 23/67  brain]
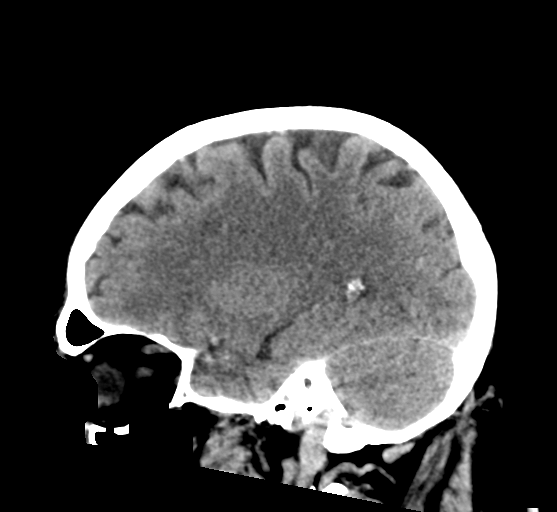
[im 34/67  brain]
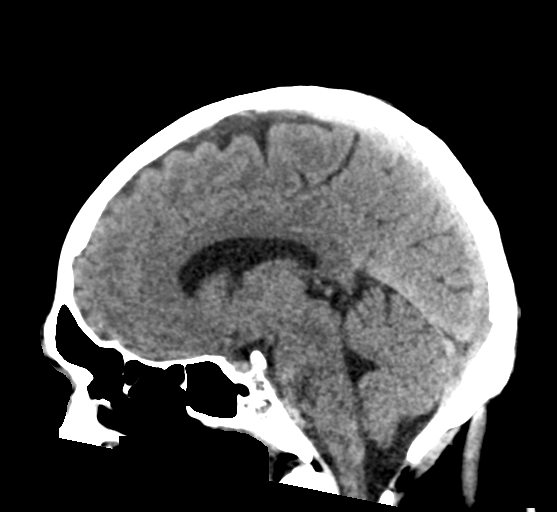
[im 45/67  brain]
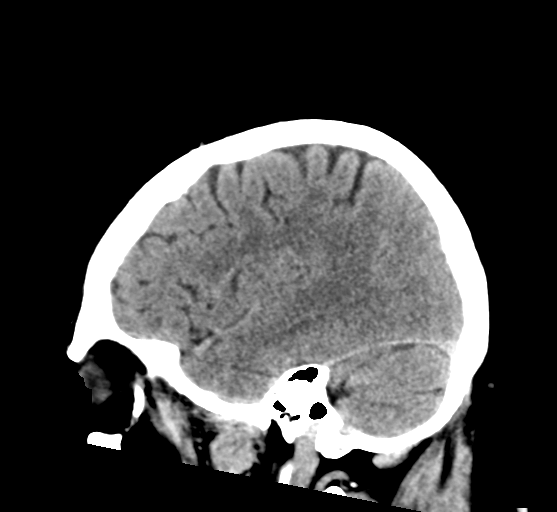

[17 of 47 positions shown; findings below may reference images not displayed]

FINDINGS: Brain: No evidence of acute infarction, hemorrhage, hydrocephalus,
extra-axial collection or mass lesion/mass effect.

Vascular: No hyperdense vessel or unexpected calcification.

Skull: Normal. Negative for fracture or focal lesion.

Sinuses/Orbits: Mucosal thickening of the ethmoid air cells
bilaterally. Remaining paranasal sinuses and mastoid air cells are
clear. The orbits are symmetric and intact.

Other: None.
IMPRESSION: 1. No acute intracranial pathology.
2. Sinus disease as detailed.

## 2022-03-01 ENCOUNTER — Ambulatory Visit
Admission: EM | Admit: 2022-03-01 | Discharge: 2022-03-01 | Disposition: A | Discharge status: Home / Self Care | Payer: Commercial Managed Care - HMO | Attending: Nurse Practitioner | Admitting: Nurse Practitioner

## 2022-03-01 ENCOUNTER — Encounter: Payer: Self-pay | Admitting: Emergency Medicine

## 2022-03-01 DIAGNOSIS — I10 Essential (primary) hypertension: Secondary | ICD-10-CM | POA: Diagnosis not present

## 2022-03-01 DIAGNOSIS — M5442 Lumbago with sciatica, left side: Secondary | ICD-10-CM | POA: Diagnosis not present

## 2022-03-01 MED ORDER — PREDNISONE 20 MG PO TABS: 40.0000 mg | ORAL_TABLET | Freq: Every day | ORAL | 0 refills | Status: AC

## 2022-03-01 MED ORDER — CYCLOBENZAPRINE HCL 10 MG PO TABS: 10.0000 mg | ORAL_TABLET | Freq: Three times a day (TID) | ORAL | 0 refills | Status: AC | PRN

## 2022-03-01 MED ORDER — LISINOPRIL 10 MG PO TABS: 10.0000 mg | ORAL_TABLET | Freq: Every day | ORAL | 0 refills | Status: AC

## 2022-03-01 NOTE — ED Provider Notes (Signed)
[facility]    CSN: [id number] Arrival date & time: 03/01/22  1731      History   Chief Complaint Chief Complaint  Patient presents with   Leg Pain   Headache    HPI Derek Pierce is a 56 y.o. male presents for evaluation of back pain.  Patient is accompanied by his son who helps to translate as he speaks Arabic.  Patient reports 1 month of an intermittent left-sided low back pain with radiation down the backside of his left leg to his knee.  He denies any numbness/tingling/weakness of his lower extremities, no bowel or bladder incontinence, no saddle paresthesia.  Symptoms began after he lifted a box.  Denies any history of back surgeries or chronic low back pain.  He has not taken any OTC medications for symptoms.  In addition patient blood pressure on intake noted to be elevated.  Patient has a history of hypertension but has not been on medication for several years.  Reports used to take lisinopril 10 mg.  He does not currently have a PCP.  He reports intermittent headache, denies headache at this time.  Denies any chest pain, shortness of breath, dizziness, visual changes, or lower extremity swelling.  No known kidney function issues.  No other concerns at this time.   Leg Pain Associated symptoms: back pain   Headache Associated symptoms: back pain     Past Medical History:  Diagnosis Date   Cholecystitis, acute 06/12/2012   Hypertension 02/03/2017    There are no problems to display for this patient.   Past Surgical History:  Procedure Laterality Date   CHOLECYSTECTOMY N/A 05/20/2012   Procedure: LAPAROSCOPIC CHOLECYSTECTOMY WITH INTRAOPERATIVE CHOLANGIOGRAM;  Surgeon: Cherylynn Ridges, MD;  Location: [facility] OR;  Service: General;  Laterality: N/A;       Home Medications    Prior to Admission medications   Medication Sig Start Date End Date Taking? Authorizing Provider  cyclobenzaprine (FLEXERIL) 10 MG tablet Take 1 tablet (10 mg total) by mouth 3  (three) times daily as needed for up to 5 days for muscle spasms. 03/01/22 03/06/22 Yes Radford Pax, NP  lisinopril (ZESTRIL) 10 MG tablet Take 1 tablet (10 mg total) by mouth daily. 03/01/22  Yes Radford Pax, NP  predniSONE (DELTASONE) 20 MG tablet Take 2 tablets (40 mg total) by mouth daily with breakfast for 5 days. 03/01/22 03/06/22 Yes Radford Pax, NP  atorvastatin (LIPITOR) 80 MG tablet Take 1 tablet by mouth once daily 11/26/18   Collie Siad A, MD  ibuprofen (ADVIL) 200 MG tablet Take 400 mg by mouth every 6 (six) hours as needed for headache.    [provider]  Phenyleph-Doxylamine-DM-APAP (ALKA-SELTZER PLS NIGHT CLD/FLU PO) Take 1 tablet by mouth daily as needed.    [provider]    Family History History reviewed. No pertinent family history.  Social History Social History   Tobacco Use   Smoking status: Former    Types: Cigarettes    Quit date: 04/05/2007    Years since quitting: 14.9   Smokeless tobacco: Never  Substance Use Topics   Alcohol use: No   Drug use: No     Allergies   Patient has no known allergies.   Review of Systems Review of Systems  Musculoskeletal:  Positive for back pain.     Physical Exam Triage Vital Signs ED Triage Vitals  Enc Vitals Group     BP 03/01/22 1906 (!) 174/95  Pulse Rate 03/01/22 1906 87     Resp 03/01/22 1906 20     Temp 03/01/22 1906 98.1 F (36.7 C)     Temp Source 03/01/22 1906 Oral     SpO2 03/01/22 1906 94 %     Weight --      Height --      Head Circumference --      Peak Flow --      Pain Score 03/01/22 1905 7     Pain Loc --      Pain Edu? --      Excl. in [place]? --    No data found.  Updated Vital Signs BP (!) 174/95 (BP Location: Left Arm)   Pulse 87   Temp 98.1 F (36.7 C) (Oral)   Resp 20   SpO2 94%   Visual Acuity Right Eye Distance:   Left Eye Distance:   Bilateral Distance:    Right Eye Near:   Left Eye Near:    Bilateral Near:     Physical Exam Vitals and  nursing note reviewed.  Constitutional:      General: He is not in acute distress.    Appearance: Normal appearance. He is not ill-appearing.  HENT:     Head: Normocephalic and atraumatic.  Eyes:     Pupils: Pupils are equal, round, and reactive to light.  Cardiovascular:     Rate and Rhythm: Normal rate.  Pulmonary:     Effort: Pulmonary effort is normal.  Musculoskeletal:     Cervical back: Normal.     Thoracic back: Normal.     Lumbar back: Spasms and tenderness present. No swelling, edema, deformity, signs of trauma, lacerations or bony tenderness. Normal range of motion. Positive left straight leg raise test. Negative right straight leg raise test. No scoliosis.       Back:     Comments: Strength 5/5 bilateral lower extremities  Skin:    General: Skin is warm and dry.  Neurological:     General: No focal deficit present.     Mental Status: He is alert and oriented to person, place, and time.     Deep Tendon Reflexes:     Reflex Scores:      Patellar reflexes are 2+ on the right side and 2+ on the left side. Psychiatric:        Mood and Affect: Mood normal.        Behavior: Behavior normal.      UC Treatments / Results  Labs (all labs ordered are listed, but only abnormal results are displayed) Labs Reviewed  BASIC METABOLIC PANEL    EKG   Radiology No results found.  Procedures Procedures (including critical care time)  Medications Ordered in UC Medications - No data to display  Initial Impression / Assessment and Plan / UC Course  I have reviewed the triage vital signs and the nursing notes.  Pertinent labs & imaging results that were available during my care of the patient were reviewed by me and considered in my medical decision making (see chart for details).     Reviewed exam and symptoms with patient Discussed low back pain with sciatica Flexeril as needed.  Side effect profile reviewed with patient Prednisone as prescribed May use Tylenol if  needed Avoid NSAIDs given hypertension Encouraged to establish with PCP. BMP drawn and provisional prescription for lisinopril given.  Patient instructed not to take until we get the results of his BMP and we confirm his  kidney function and potassium levels are normal. He verbalized understanding Discussed DASH diet Advised to keep a BP log and take to his primary care Rest and heat to the low back as needed Strict ER precautions reviewed and patient verbalized understanding Final Clinical Impressions(s) / UC Diagnoses   Final diagnoses:  Hypertension, unspecified type  Acute left-sided low back pain with left-sided sciatica     Discharge Instructions      Flexeril as needed.  Please note this medication can make you drowsy.  Do not drink alcohol or drive while under the influence of this medication Prednisone daily Heat to the low back Do not take the lisinopril until the results of your blood work is back. Keep a blood pressure log and take to your primary care for further evaluation Avoid high salt foods Please go to the emergency room for any worsening symptoms I hope you feel better soon!    ED Prescriptions     Medication Sig Dispense Auth. Provider   cyclobenzaprine (FLEXERIL) 10 MG tablet Take 1 tablet (10 mg total) by mouth 3 (three) times daily as needed for up to 5 days for muscle spasms. 15 tablet Melynda Ripple, NP   predniSONE (DELTASONE) 20 MG tablet Take 2 tablets (40 mg total) by mouth daily with breakfast for 5 days. 10 tablet Melynda Ripple, NP   lisinopril (ZESTRIL) 10 MG tablet Take 1 tablet (10 mg total) by mouth daily. 30 tablet Melynda Ripple, NP      PDMP not reviewed this encounter.   Melynda Ripple, NP 03/01/22 1935

## 2022-03-01 NOTE — Discharge Instructions (Addendum)
Flexeril as needed.  Please note this medication can make you drowsy.  Do not drink alcohol or drive while under the influence of this medication Prednisone daily Heat to the low back Do not take the lisinopril until the results of your blood work is back. Keep a blood pressure log and take to your primary care for further evaluation Avoid high salt foods Please go to the emergency room for any worsening symptoms I hope you feel better soon!

## 2022-03-01 NOTE — ED Triage Notes (Signed)
Pt reports pain extending from lower back to left knee. States pain started 1 month ago. Denies any injuries.   Also reports a headache x 3 weeks.

## 2022-03-03 LAB — BASIC METABOLIC PANEL
BUN/Creatinine Ratio: 16 (ref 9–20)
BUN: 14 mg/dL (ref 6–24)
CO2: 25 mmol/L (ref 20–29)
Calcium: 9.4 mg/dL (ref 8.7–10.2)
Chloride: 103 mmol/L (ref 96–106)
Creatinine, Ser: 0.88 mg/dL (ref 0.76–1.27)
Glucose: 140 mg/dL — ABNORMAL HIGH (ref 70–99)
Potassium: 3.6 mmol/L (ref 3.5–5.2)
Sodium: 144 mmol/L (ref 134–144)
eGFR: 100 mL/min/{1.73_m2} (ref >59)

## 2022-03-19 ENCOUNTER — Ambulatory Visit
Admission: EM | Admit: 2022-03-19 | Discharge: 2022-03-19 | Disposition: A | Discharge status: Home / Self Care | Payer: Commercial Managed Care - HMO | Attending: Emergency Medicine | Admitting: Emergency Medicine

## 2022-03-19 ENCOUNTER — Other Ambulatory Visit: Payer: Self-pay

## 2022-03-19 ENCOUNTER — Encounter: Payer: Self-pay | Admitting: *Deleted

## 2022-03-19 DIAGNOSIS — J09X2 Influenza due to identified novel influenza A virus with other respiratory manifestations: Secondary | ICD-10-CM | POA: Diagnosis not present

## 2022-03-19 LAB — POCT INFLUENZA A/B
Influenza A, POC: POSITIVE — AB
Influenza B, POC: NEGATIVE

## 2022-03-19 MED ORDER — IBUPROFEN 800 MG PO TABS
800.0000 mg | ORAL_TABLET | Freq: Once | ORAL | Status: AC
  Administered 2022-03-19: 800 mg via ORAL

## 2022-03-19 MED ORDER — OSELTAMIVIR PHOSPHATE 75 MG PO CAPS: 75.0000 mg | ORAL_CAPSULE | Freq: Two times a day (BID) | ORAL | 0 refills | Status: AC

## 2022-03-19 NOTE — ED Triage Notes (Signed)
C/O HA onset yesterday. States was seen 03/01/22 and was told to wait for phone call do determine if he could start with HTN med based on blood work, but he never heard anything, so he has not started it yet. Has taken Alka Seltzer last night. Denies taking any meds today.  C/O generalized body aches onset yesterday. Denies cough or runny nose.

## 2022-03-19 NOTE — ED Provider Notes (Signed)
[facility]   [id number] 03/19/22 Arrival Time: 1304  Chief Complaint  Patient presents with   Headache    Entered by patient     SUBJECTIVE: History from: patient.  Derek Pierce is a 56 y.o. male who presented to the urgent care with a complaint of fever, headache and body aches that started yesterday.  Denies sick exposure to COVID, flu or strep.  Denies recent travel.  Has tried tried any OTC medication for relief.  Denies any any aggravating factors.  Denies previous symptoms in the past.   Denies  fatigue, sinus pain, rhinorrhea, sore throat, SOB, wheezing, chest pain, nausea, changes in bowel or bladder habits.     ROS: As per HPI.  All other pertinent ROS negative.      Past Medical History:  Diagnosis Date   Cholecystitis, acute 06/12/2012   Hypertension 02/03/2017   Past Surgical History:  Procedure Laterality Date   CHOLECYSTECTOMY N/A 05/20/2012   Procedure: LAPAROSCOPIC CHOLECYSTECTOMY WITH INTRAOPERATIVE CHOLANGIOGRAM;  Surgeon: Cherylynn Ridges, MD;  Location: [facility] OR;  Service: General;  Laterality: N/A;   No Known Allergies No current facility-administered medications on file prior to encounter.   Current Outpatient Medications on File Prior to Encounter  Medication Sig Dispense Refill   atorvastatin (LIPITOR) 80 MG tablet Take 1 tablet by mouth once daily 30 tablet 0   ibuprofen (ADVIL) 200 MG tablet Take 400 mg by mouth every 6 (six) hours as needed for headache.     Phenyleph-Doxylamine-DM-APAP (ALKA-SELTZER PLS NIGHT CLD/FLU PO) Take 1 tablet by mouth daily as needed.     lisinopril (ZESTRIL) 10 MG tablet Take 1 tablet (10 mg total) by mouth daily. 30 tablet 0   Social History   Socioeconomic History   Marital status: Married    Spouse name: Not on file   Number of children: Not on file   Years of education: Not on file   Highest education level: Not on file  Occupational History   Not on file  Tobacco Use   Smoking status: Former     Types: Cigarettes    Quit date: 04/05/2007    Years since quitting: 14.9   Smokeless tobacco: Never  Vaping Use   Vaping Use: Never used  Substance and Sexual Activity   Alcohol use: No   Drug use: No   Sexual activity: Not on file  Other Topics Concern   Not on file  Social History Narrative   Not on file   Social Determinants of Health   Financial Resource Strain: Not on file  Food Insecurity: Not on file  Transportation Needs: Not on file  Physical Activity: Not on file  Stress: Not on file  Social Connections: Not on file  Intimate Partner Violence: Not on file   History reviewed. No pertinent family history.  OBJECTIVE:  Vitals:   03/19/22 1449  BP: (!) 175/100  Pulse: (!) 102  Resp: (!) 22  Temp: 99.6 F (37.6 C)  TempSrc: Oral  SpO2: 93%     General appearance: alert; appears fatigued, but nontoxic; speaking in full sentences and tolerating own secretions HEENT: NCAT; Ears: EACs clear, TMs pearly gray; Eyes: PERRL.  EOM grossly intact. Sinuses: nontender; Nose: nares patent without rhinorrhea, Throat: oropharynx clear, tonsils non erythematous or enlarged, uvula midline  Neck: supple without LAD Lungs: unlabored respirations, symmetrical air entry; cough: absent; no respiratory distress; CTAB Heart: regular rate and rhythm.  Radial pulses 2+ symmetrical bilaterally Skin: warm and dry  Psychological: alert and cooperative; normal mood and affect  LABS:  Results for orders placed or performed during the hospital encounter of 03/19/22 (from the past 24 hour(s))  POCT Influenza A/B     Status: Abnormal   Collection Time: 03/19/22  3:17 PM  Result Value Ref Range   Influenza A, POC Positive (A) Negative   Influenza B, POC Negative Negative     ASSESSMENT & PLAN:  1. Influenza due to identified novel influenza A virus with other respiratory manifestations     Meds ordered this encounter  Medications   ibuprofen (ADVIL) tablet 800 mg   oseltamivir  (TAMIFLU) 75 MG capsule    Sig: Take 1 capsule (75 mg total) by mouth every 12 (twelve) hours for 5 days.    Dispense:  10 capsule    Refill:  0   Discharge instructions  Influenza test is positive for influenza A Tamiflu was prescribed/take as directed Get plenty of rest and push fluids Use medications daily for symptom relief Use OTC medications like ibuprofen or tylenol as needed fever or pain Call or go to the ED if you have any new or worsening symptoms such as fever, worsening cough, shortness of breath, chest tightness, chest pain, turning blue, changes in mental status, etc...    Reviewed expectations re: course of current medical issues. Questions answered. Outlined signs and symptoms indicating need for more acute intervention. Patient verbalized understanding. After Visit Summary given.          Durward Parcel, FNP 03/19/22 1521

## 2022-03-19 NOTE — Discharge Instructions (Signed)
Influenza test is positive for influenza A Tamiflu was prescribed/take as directed Get plenty of rest and push fluids Use medications daily for symptom relief Use OTC medications like ibuprofen or tylenol as needed fever or pain Call or go to the ED if you have any new or worsening symptoms such as fever, worsening cough, shortness of breath, chest tightness, chest pain, turning blue, changes in mental status, etc..Marland Kitchen

## 2022-05-09 ENCOUNTER — Telehealth: Payer: Self-pay

## 2022-05-09 NOTE — Telephone Encounter (Signed)
Mychart msg sent. AS, CMA
